# Patient Record
Sex: Female | Born: 2000 | Race: Black or African American | Hispanic: No | Marital: Single | Smoking: Never smoker
Health system: Southern US, Community
[De-identification: ages and names within clinical notes are randomized; demographics above are authoritative.]

## PROBLEM LIST (undated history)

## (undated) DIAGNOSIS — G08 Intracranial and intraspinal phlebitis and thrombophlebitis: Secondary | ICD-10-CM

## (undated) HISTORY — PX: KNEE ARTHROSCOPY: SUR90

## (undated) HISTORY — PX: KNEE CARTILAGE SURGERY: SHX688

---

## 2020-09-01 ENCOUNTER — Ambulatory Visit: Payer: Self-pay | Attending: Internal Medicine

## 2020-09-01 DIAGNOSIS — Z23 Encounter for immunization: Secondary | ICD-10-CM

## 2020-09-01 NOTE — Progress Notes (Signed)
   Covid-19 Vaccination Clinic  Name:  Sahiba Granholm    MRN: [id number] DOB: 11/28/2000  09/01/2020  Ms. Flores was observed post Covid-19 immunization for 30 minutes based on pre-vaccination screening without incident. She was provided with Vaccine Information Sheet and instruction to access the V-Safe system.   Ms. Heather was instructed to call 911 with any severe reactions post vaccine: Marland Kitchen Difficulty breathing  . Swelling of face and throat  . A fast heartbeat  . A bad rash all over body  . Dizziness and weakness   Immunizations Administered    Name Date Dose VIS Date Route   Moderna COVID-19 Vaccine 09/01/2020 11:38 AM 0.5 mL 10/2019 Intramuscular   Manufacturer: Moderna   Lot: [id number]   NDC: 16010-932-35

## 2021-07-16 ENCOUNTER — Ambulatory Visit (HOSPITAL_BASED_OUTPATIENT_CLINIC_OR_DEPARTMENT_OTHER)
Admission: RE | Admit: 2021-07-16 | Discharge: 2021-07-16 | Disposition: A | Discharge status: Home / Self Care | Payer: No Typology Code available for payment source | Source: Ambulatory Visit | Attending: Orthopaedic Surgery | Admitting: Orthopaedic Surgery

## 2021-07-16 ENCOUNTER — Other Ambulatory Visit (HOSPITAL_BASED_OUTPATIENT_CLINIC_OR_DEPARTMENT_OTHER): Payer: Self-pay | Admitting: Orthopaedic Surgery

## 2021-07-16 ENCOUNTER — Other Ambulatory Visit: Payer: Self-pay

## 2021-07-16 ENCOUNTER — Encounter (HOSPITAL_BASED_OUTPATIENT_CLINIC_OR_DEPARTMENT_OTHER): Payer: Self-pay | Admitting: Orthopaedic Surgery

## 2021-07-16 ENCOUNTER — Ambulatory Visit (INDEPENDENT_AMBULATORY_CARE_PROVIDER_SITE_OTHER): Payer: Self-pay | Admitting: Orthopaedic Surgery

## 2021-07-16 VITALS — BP 133/82 | Ht 67.5 in | Wt 133.6 lb

## 2021-07-16 DIAGNOSIS — M25561 Pain in right knee: Secondary | ICD-10-CM

## 2021-07-16 DIAGNOSIS — M25361 Other instability, right knee: Secondary | ICD-10-CM

## 2021-07-16 NOTE — Addendum Note (Signed)
Addended by: Benancio Deeds on: 07/16/2021 03:25 PM   Modules accepted: Orders

## 2021-07-16 NOTE — Progress Notes (Signed)
Chief Complaint: Right knee instability     History of Present Illness:   Pain Score: 7/10 SANE: 60/100  Rebecca Beltran is a 20 y.o. 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  She states that this has been going on for many years since before 2016 when she ultimately had the left knee stabilized.  This was done by Dr. Tyler Deis.  They performed a tibial tubercle osteotomy at that time.  She was also having issues with the right knee at that time but this was not addressed as they were deciding to stage the surgeries.  She states that she has subluxations or dislocations on a nearly weekly basis.  The most recent time this happened was approximately 1 week prior to this visit when she had significant notable swelling following this.  She was unable to improve the swelling until she was given a Medrol Dosepak.  She has pain in the knee with bending and stairs.  She has a constant dull ache in the knee.    Surgical History:   Left knee tibial tubercle osteotomy 2016  PMH/PSH/Family History/Social History/Meds/Allergies:   History reviewed. No pertinent past medical history. History reviewed. No pertinent surgical history. Social History   Socioeconomic History   Marital status: Single    Spouse name: Not on file   Number of children: Not on file   Years of education: Not on file   Highest education level: Not on file  Occupational History   Not on file  Tobacco Use   Smoking status: Not on file   Smokeless tobacco: Not on file  Substance and Sexual Activity   Alcohol use: Not on file   Drug use: Not on file   Sexual activity: Not on file  Other Topics Concern   Not on file  Social History Narrative   Not on file   Social Determinants of Health   Financial Resource Strain: Not on file  Food Insecurity: Not on file  Transportation Needs: Not on file  Physical Activity: Not on file  Stress: Not on file  Social Connections: Not  on file   History reviewed. No pertinent family history. Not on File No current outpatient medications on file.   No current facility-administered medications for this visit.   No results found.  Review of Systems:   A ROS was performed including pertinent positives and negatives as documented in the HPI.  Physical Exam :   Constitutional: NAD and appears stated age Neurological: Alert and oriented Psych: Appropriate affect and cooperative There were no vitals taken for this visit.   Comprehensive Musculoskeletal Exam:     Musculoskeletal Exam  Gait Normal  Alignment Normal   Right Left  Inspection Normal Normal  Palpation    Tenderness Patellofemoral None  Crepitus None None  Effusion 1+ None  Range of Motion    Extension 0 0  Flexion 130 130  Strength    Extension 5/5 5/5  Flexion 5/5 5/5  Ligament Exam     Generalized Laxity No No  Lachman Negative Negative   Pivot Shift Negative Negative  Anterior Drawer Negative Negative  Valgus at 0 Negative Negative  Valgus at 20 Negative Negative  Varus at 0 0 0  Varus at 20   0 0  Posterior Drawer at 90 0 0  Vascular/Lymphatic Exam    Edema None None  Venous Stasis Changes No No  Distal Circulation Normal Normal  Neurologic    Light Touch Sensation Intact Intact  Special Tests:    She has positive J sign.  She has positive apprehension when the knee is brought into extension.  She has 3 quadrants of lateral subluxation which causes significant apprehension  Imaging:   Xray (right knee 4 views): Normal right knee with mild lateral patellar tilt   I personally reviewed and interpreted the radiographs.   Assessment:   20 year old female with right patellar instability.  She is experiencing subluxations on a weekly basis.  Her last dislocation resulted in significant swelling.  Given her recent injury I believe that an MRI is indicated in order to assess the status of her underlying cartilage.  This will also  allow [place] to calculating TT TG distance and ultimate preparation for surgical planning of her patellar realignment to restore stability  Plan :    -MRI right knee -Follow-up 2 weeks to discuss results of the MRI  I believe that advance imaging in the form of an MRI is indicated for the following reasons: -Xrays images were obtained and not diagnostic -The following worrisome symptoms are present on history and exam: Recent injury with significant swelling concerning for chondral injury - MRI is required to assist in specific surgical planning    I personally saw and evaluated the patient, and participated in the management and treatment plan.  Huel Cote, MD Attending Physician, Orthopedic Surgery  This document was dictated using Dragon voice recognition software. A reasonable attempt at proof reading has been made to minimize errors.

## 2021-07-20 ENCOUNTER — Emergency Department (HOSPITAL_COMMUNITY): Payer: No Typology Code available for payment source

## 2021-07-20 ENCOUNTER — Encounter (HOSPITAL_COMMUNITY): Payer: Self-pay

## 2021-07-20 ENCOUNTER — Inpatient Hospital Stay (HOSPITAL_COMMUNITY)
Admission: EM | Admit: 2021-07-20 | Discharge: 2021-07-22 | DRG: 093 | Disposition: A | Discharge status: Home / Self Care | Payer: No Typology Code available for payment source | Attending: Neurology | Admitting: Neurology

## 2021-07-20 DIAGNOSIS — R29702 NIHSS score 2: Secondary | ICD-10-CM | POA: Diagnosis present

## 2021-07-20 DIAGNOSIS — G08 Intracranial and intraspinal phlebitis and thrombophlebitis: Principal | ICD-10-CM | POA: Diagnosis present

## 2021-07-20 DIAGNOSIS — H532 Diplopia: Secondary | ICD-10-CM | POA: Diagnosis present

## 2021-07-20 DIAGNOSIS — Z79899 Other long term (current) drug therapy: Secondary | ICD-10-CM

## 2021-07-20 DIAGNOSIS — T384X5A Adverse effect of oral contraceptives, initial encounter: Secondary | ICD-10-CM | POA: Diagnosis present

## 2021-07-20 DIAGNOSIS — Z20822 Contact with and (suspected) exposure to covid-19: Secondary | ICD-10-CM | POA: Diagnosis present

## 2021-07-20 LAB — I-STAT CHEM 8, ED
BUN: 12 mg/dL (ref 6–20)
Calcium, Ion: 1.1 mmol/L — ABNORMAL LOW (ref 1.15–1.40)
Chloride: 105 mmol/L (ref 98–111)
Creatinine, Ser: 0.9 mg/dL (ref 0.44–1.00)
Glucose, Bld: 133 mg/dL — ABNORMAL HIGH (ref 70–99)
HCT: 46 % (ref 36.0–46.0)
Hemoglobin: 15.6 g/dL — ABNORMAL HIGH (ref 12.0–15.0)
Potassium: 3.6 mmol/L (ref 3.5–5.1)
Sodium: 140 mmol/L (ref 135–145)
TCO2: 23 mmol/L (ref 22–32)

## 2021-07-20 LAB — RESP PANEL BY RT-PCR (FLU A&B, COVID) ARPGX2
Influenza A by PCR: NEGATIVE
Influenza B by PCR: NEGATIVE
SARS Coronavirus 2 by RT PCR: NEGATIVE

## 2021-07-20 LAB — I-STAT BETA HCG BLOOD, ED (MC, WL, AP ONLY): I-stat hCG, quantitative: <5 m[IU]/mL (ref <5)

## 2021-07-20 MED ORDER — SENNOSIDES-DOCUSATE SODIUM 8.6-50 MG PO TABS
1.0000 | ORAL_TABLET | Freq: Two times a day (BID) | ORAL | Status: DC
  Filled 2021-07-20: qty 1

## 2021-07-20 MED ORDER — PANTOPRAZOLE SODIUM 40 MG IV SOLR
40.0000 mg | Freq: Every day | INTRAVENOUS | Status: DC
  Administered 2021-07-21: 40 mg via INTRAVENOUS
  Filled 2021-07-20: qty 40

## 2021-07-20 MED ORDER — PROCHLORPERAZINE EDISYLATE 10 MG/2ML IJ SOLN
5.0000 mg | Freq: Once | INTRAMUSCULAR | Status: AC
  Administered 2021-07-20: 5 mg via INTRAVENOUS
  Filled 2021-07-20: qty 2

## 2021-07-20 MED ORDER — IOHEXOL 350 MG/ML SOLN
75.0000 mL | Freq: Once | INTRAVENOUS | Status: AC | PRN
  Administered 2021-07-20: 75 mL via INTRAVENOUS

## 2021-07-20 MED ORDER — CLEVIDIPINE BUTYRATE 0.5 MG/ML IV EMUL: 0.0000 mg/h | INTRAVENOUS | Status: DC

## 2021-07-20 MED ORDER — ACETAMINOPHEN 325 MG PO TABS
650.0000 mg | ORAL_TABLET | ORAL | Status: DC | PRN
  Administered 2021-07-21 – 2021-07-22 (×5): 650 mg via ORAL
  Filled 2021-07-20 (×5): qty 2

## 2021-07-20 MED ORDER — LABETALOL HCL 5 MG/ML IV SOLN
5.0000 mg | INTRAVENOUS | Status: DC | PRN
Start: 2021-07-20 — End: 2021-07-22

## 2021-07-20 MED ORDER — ACETAMINOPHEN 650 MG RE SUPP: 650.0000 mg | RECTAL | Status: DC | PRN

## 2021-07-20 MED ORDER — ACETAMINOPHEN 160 MG/5ML PO SOLN: 650.0000 mg | ORAL | Status: DC | PRN

## 2021-07-20 MED ORDER — FENTANYL CITRATE PF 50 MCG/ML IJ SOSY
25.0000 ug | PREFILLED_SYRINGE | Freq: Once | INTRAMUSCULAR | Status: AC
  Administered 2021-07-20: 25 ug via INTRAVENOUS
  Filled 2021-07-20: qty 1

## 2021-07-20 MED ORDER — ONDANSETRON HCL 4 MG/2ML IJ SOLN
INTRAMUSCULAR | Status: AC
  Filled 2021-07-20: qty 2

## 2021-07-20 MED ORDER — SODIUM CHLORIDE 0.9 % IV BOLUS
1000.0000 mL | Freq: Once | INTRAVENOUS | Status: AC
  Administered 2021-07-20: 1000 mL via INTRAVENOUS

## 2021-07-20 MED ORDER — STROKE: EARLY STAGES OF RECOVERY BOOK: Freq: Once | Status: DC

## 2021-07-20 MED ORDER — HEPARIN (PORCINE) 25000 UT/250ML-% IV SOLN
850.0000 [IU]/h | INTRAVENOUS | Status: DC
  Administered 2021-07-20: 700 [IU]/h via INTRAVENOUS
  Administered 2021-07-22: 850 [IU]/h via INTRAVENOUS
  Filled 2021-07-20 (×2): qty 250

## 2021-07-20 NOTE — ED Notes (Signed)
Pt ambulated to restroom with a steady gait

## 2021-07-20 NOTE — ED Notes (Signed)
Pt vomiting in room with MD at bedside. See new orders.

## 2021-07-20 NOTE — ED Provider Notes (Signed)
[facility] EMERGENCY DEPARTMENT Provider Note   CSN: [id number] Arrival date & time: 07/20/21  1809     History Chief Complaint  Patient presents with   Headache   Migraine    Pocahontas Cohenour is a 20 y.o. female.  HPI Patient presents with headache, blurry vision, left eye. Onset was today, no clear precipitant. She is on oral contraceptives, otherwise takes no medication regularly, he is generally well. Since onset been in persistent, sore, severe, left parietal, nonradiating substantially.  No right eye vision changes, no extremity weakness.  No relief with anything.    History reviewed. No pertinent past medical history.  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 No pertinent surgical history.   OB History   No obstetric history on file.     No family history on file.     Home Medications Prior to Admission medications   Not on File    Allergies    Patient has no allergy information on record.  Review of Systems   Review of Systems  Constitutional:        Per HPI, otherwise negative  HENT:         Per HPI, otherwise negative  Eyes:  Positive for visual disturbance.  Respiratory:         Per HPI, otherwise negative  Cardiovascular:        Per HPI, otherwise negative  Gastrointestinal:  Negative for vomiting.  Endocrine:       Negative aside from HPI  Genitourinary:        Neg aside from HPI   Musculoskeletal:        Per HPI, otherwise negative  Skin: Negative.   Neurological:  Positive for headaches. Negative for syncope.   Physical Exam Updated Vital Signs BP (!) 132/93 (BP Location: Left Arm)   Pulse 76   Temp 98.6 F (37 C) (Oral)   Resp 15   Ht 5\' 7"  (1.702 m)   Wt 60.3 kg   SpO2 100%   BMI 20.83 kg/m   Physical Exam Vitals and nursing note reviewed.  Constitutional:      Appearance: She is well-developed.     Comments: Uncomfortable appearing  thin young female awake and alert  HENT:     Head: Normocephalic and atraumatic.  Eyes:     Conjunctiva/sclera: Conjunctivae normal.  Cardiovascular:     Rate and Rhythm: Normal rate and regular rhythm.  Pulmonary:     Effort: Pulmonary effort is normal. No respiratory distress.     Breath sounds: Normal breath sounds. No stridor.  Abdominal:     General: There is no distension.  Skin:    General: Skin is warm and dry.  Neurological:     Mental Status: She is alert and oriented to person, place, and time.     Cranial Nerves: No cranial nerve deficit, dysarthria or facial asymmetry.     Motor: No weakness.    ED Results / Procedures / Treatments   Labs (all labs ordered are listed, but only abnormal results are displayed) Labs Reviewed  I-STAT CHEM 8, ED - Abnormal; Notable for the following components:      Result Value   Glucose, Bld 133 (*)    Calcium, Ion 1.10 (*)    Hemoglobin 15.6 (*)    All other components within normal limits  RESP PANEL BY RT-PCR (FLU A&B, COVID) ARPGX2  HEPARIN  LEVEL (UNFRACTIONATED)  CBC  I-STAT BETA HCG BLOOD, ED ([facility], [facility], [facility] ONLY)    Radiology CT HEAD WO CONTRAST ( )  Result Date: 07/20/2021 CLINICAL DATA:  Headache EXAM: CT HEAD WITHOUT CONTRAST TECHNIQUE: Contiguous axial images were obtained from the base of the skull through the vertex without intravenous contrast. COMPARISON:  None. FINDINGS: Brain: There is no mass, hemorrhage or extra-axial collection. The size and configuration of the ventricles and extra-axial CSF spaces are normal. The brain parenchyma is normal, without acute or chronic infarction. Vascular: There is hyperdensity of the most lateral aspect of the left transverse sinus, likely indicating thrombosis. Small amount of hyperdensity in the adjacent posterior fossa may be a thrombosed cortical vein. Skull: The visualized skull base, calvarium and extracranial soft tissues are normal. Sinuses/Orbits: No fluid levels or advanced  mucosal thickening of the visualized paranasal sinuses. No mastoid or middle ear effusion. The orbits are normal. IMPRESSION: 1. Hyperdense appearance of the most lateral aspect of the left transverse sinus, likely indicating thrombosis. Small amount of hyperdensity in the adjacent posterior fossa may be a thrombosed cortical vein, though a small amount of subarachnoid blood is also possible. 2. CT venogram is recommended for confirmation and to better characterize the extent of occlusion. Critical Value/emergent results were called by telephone at the time of interpretation on 07/20/2021 at 9:00 pm to provider [facility] , who verbally acknowledged these results. Electronically Signed   By: Deatra Robinson M.D.   On: 07/20/2021 21:02   CT VENOGRAM HEAD  Result Date: 07/20/2021 CLINICAL DATA:  Headache and suspected dural venous sinus thrombosis. EXAM: CT VENOGRAM HEAD TECHNIQUE: Venographic phase images of the brain were obtained following the administration of intravenous contrast. Multiplanar reformats and maximum intensity projections were generated. CONTRAST:  52mL OMNIPAQUE IOHEXOL 350 MG/ML SOLN COMPARISON:  None. FINDINGS: Superior sagittal sinus: Normal. Straight sinus: Normal. Inferior sagittal sinus, vein of Galen and internal cerebral veins: Normal. Transverse sinuses: There is occlusive thrombus within the lateral left transverse sinus with suspected thrombosis of an adjacent cortical vein. The right transverse sinus is normal. Sigmoid sinuses: Left sigmoid sinus is nonopacified.  Normal right. Visualized jugular veins: Left internal jugular vein is non-opacified. Normal right. IMPRESSION: 1. Occlusive thrombus within the lateral left transverse sinus with suspected thrombosis of an adjacent cortical vein. 2. No enhancement within the left sigmoid sinus or internal jugular vein. Electronically Signed   By: Deatra Robinson M.D.   On: 07/20/2021 21:37    Procedures Procedures   Medications Ordered  in ED Medications  labetalol (NORMODYNE) injection 5 mg (has no administration in time range)  heparin ADULT infusion 100 units/mL (25000 units/240mL) (has no administration in time range)  iohexol (OMNIPAQUE) 350 MG/ML injection 75 mL (75 mLs Intravenous Contrast Given 07/20/21 2128)  sodium chloride 0.9 % bolus 1,000 mL (1,000 mLs Intravenous New Bag/Given 07/20/21 2202)  fentaNYL (SUBLIMAZE) injection 25 mcg (25 mcg Intravenous Given 07/20/21 2202)  ondansetron (ZOFRAN) 4 MG/2ML injection (  Given 07/20/21 2213)    ED Course  I have reviewed the triage vital signs and the nursing notes.  Pertinent labs & imaging results that were available during my care of the patient were reviewed by me and considered in my medical decision making (see chart for details).  Discussed the patient's case with our quick assessment team after she had triage evaluation, CT ordered. Reviewed his images, and discussed findings with the patient upon initial evaluation in the room.  Soon thereafter discussed her case  with our neurology colleagues.  Given concern for venous thromboembolism, intracranial, we discussed options for management.  Patient will start heparin drip, require ICU admission to the neuro team.  I discussed all findings with the patient's mother via speaker phone in the room.  Patient received Zofran, fentanyl for symptom control. MDM Rules/Calculators/A&P MDM Number of Diagnoses or Management Options Acute idiopathic CVST: new, needed workup   Amount and/or Complexity of Data Reviewed Clinical lab tests: ordered and reviewed Tests in the radiology section of CPT: ordered and reviewed Tests in the medicine section of CPT: reviewed and ordered Decide to obtain previous medical records or to obtain history from someone other than the patient: yes Review and summarize past medical records: yes Discuss the patient with other providers: yes Independent visualization of images, tracings, or  specimens: yes  Risk of Complications, Morbidity, and/or Mortality Presenting problems: high Diagnostic procedures: high Management options: high  Critical Care Total time providing critical care: 30-74 minutes (45)  Patient Progress Patient progress: stable   Final Clinical Impression(s) / ED Diagnoses Final diagnoses:  Acute idiopathic CVST     Gerhard Munch, MD 07/20/21 2254

## 2021-07-20 NOTE — ED Provider Notes (Addendum)
Emergency Medicine Provider Triage Evaluation Note  Rebecca Beltran , a 20 y.o. female  was evaluated in triage.  Pt complains of headache located left side of the head.  Has history of headaches but states is worse.  Review of Systems  Positive: Headache, eye pain Negative: fever  Physical Exam  BP 121/89 (BP Location: Left Arm)   Pulse 83   Temp 98.6 F (37 C) (Oral)   Resp 16   Ht 5\' 7"  (1.702 m)   Wt 60.3 kg   SpO2 97%   BMI 20.83 kg/m  Gen:   Awake, no distress   Resp:  Normal effort  MSK:   Moves extremities without difficulty  Other:  Perrl, no facial droop, clear speech, 5/5 strength to the BUE and BLE  Medical Decision Making  Medically screening exam initiated at 7:47 PM.  Appropriate orders placed.  was informed that the remainder of the evaluation will be completed by another provider, this initial triage assessment does not replace that evaluation, and the importance of remaining in the ED until their evaluation is complete.  Discussed with radiology who is concerned for venous sinus thrombosis with possible associated bleeding. Recommends ct venogram of the head.   Advised nursing staff pt needs to be prioritized for a room   Darleene Cleaver, PA-C 07/20/21 1947    Carlene Bickley S, PA-C 07/20/21 2152    2153 P, DO 07/21/21 0000

## 2021-07-20 NOTE — H&P (Signed)
Neurology Consultation Reason for Consult: Cerebral venous sinus thrombosis Requesting Physician: Gerhard Munch  CC: Headache  History is obtained from: Patient and chart review  HPI: Rebecca Beltran is a 20 y.o. female with past medical history significant for bilateral knee dislocations, oral contraceptive use, migraine headaches, presenting with worst headache of life, found to have CVST.  She reports she has been in her normal state of good health recently but had a left-sided severe headache this morning associated with nausea, vomiting, light sensitivity, sound sensitivity, and subsequently paresthesias in the left arm and leg and double vision that developed while she was in the ED.  She states her oral contraceptive pill was changed recently and it does contain estrogen.  She additionally completed a course of prednisone for knee dislocation pain 2 days prior to admission (5-day course).  Regarding her regular headaches she has migraines less than once a month (approximately 10 episodes a year) which she notes are triggered by loud noise flashing lights and associated with nausea/vomiting/light and sound sensitivity, but these have never been so severe as today.  TNK given?: No, not indicated for CVST w/ hemorrhage Premorbid modified rankin scale:      0 - No symptoms.   ICH Score:   Time performed: 11:30 PM on 9/13 GCS: 13-15 is 0 points Infratentorial: No.. If yes, 1 point Volume: <30cc is 0 points  Age: 20 y.o.. >80 is 1 point Intraventricular extension is 1 point  Score:0  A Score of 0 points has a 30 day mortality of 0%. Stroke. 2001 Apr;32(4):891-7.  ROS: All other review of systems was negative except as noted in the HPI.   History reviewed. No pertinent past medical history.   No family history on file. Mother denies any family history of blood clots or other significant issues  Social History:  has no history on file for tobacco use, alcohol use, and drug  use. Patient denies any substance use  Exam: Current vital signs: BP 129/89   Pulse 73   Temp 97.7 F (36.5 C) (Oral)   Resp 16   Ht 5\' 7"  (1.702 m)   Wt 60.3 kg   SpO2 100%   BMI 20.83 kg/m  Vital signs in last 24 hours: Temp:  [97.7 F (36.5 C)-98.6 F (37 C)] 97.7 F (36.5 C) (09/13 2300) Pulse Rate:  [73-83] 73 (09/13 2300) Resp:  [15-16] 16 (09/13 2300) BP: (121-132)/(89-93) 129/89 (09/13 2300) SpO2:  [97 %-100 %] 100 % (09/13 2300) Weight:  [60.3 kg] 60.3 kg (09/13 1917)   Physical Exam  Constitutional: Appears well-developed and well-nourished.  Intermittently has severe retching, dry heaves Psych: Affect appropriate to situation, somewhat anxious and tearful with pain Eyes: No scleral injection HENT: No oropharyngeal obstruction.  MSK: no joint deformities.  Cardiovascular: Normal rate and regular rhythm.  Respiratory: Effort normal, non-labored breathing GI: Soft.  No distension. There is no tenderness.  Skin: Warm dry and intact visible skin  Neuro: Mental Status: Patient is slightly drowsy but alerts easily, oriented to person, place, month, year, and situation. Patient is able to give a clear and coherent history. No signs of aphasia or neglect Cranial Nerves: II: Visual Fields are full. Pupils are equal, round, and reactive to light.   III,IV, VI: EOMI without ptosis or diploplia.  V: Facial sensation is reduced to temperature in left V1, V2 and V3 VII: Facial movement is symmetric.  VIII: hearing is intact to voice X: Uvula elevates symmetrically XI: Shoulder shrug is symmetric.  XII: tongue is midline without atrophy or fasciculations.  Motor: Tone is normal. Bulk is normal.  Confrontational testing limited secondary to severely increased pain with any Valsalva.  No pronator drift and no drift of the bilateral lower extremities Sensory: Sensation is symmetric to light touch in the arms and legs, but is reduced to temperature in the left face and  arm Deep Tendon Reflexes: 3+ and symmetric in the biceps and patellae  Plantars: Toes are downgoing bilaterally.  Cerebellar: FNF and HKS are intact bilaterally  NIHSS total 2 Score breakdown: Reduced temperature sensation on the left face and arm one-point, mild drowsiness one-point   I have reviewed labs in epic and the results pertinent to this consultation are: BMP with creatinine of 0.9, hemoglobin 15.6, beta-hCG negative, respiratory viral panel and SARS-CoV-2 negative  I have personally reviewed the images obtained and agree with radiology reads  Head CT: 1. Hyperdense appearance of the most lateral aspect of the left transverse sinus, likely indicating thrombosis. Small amount of hyperdensity in the adjacent posterior fossa may be a thrombosed cortical vein, though a small amount of subarachnoid blood is also possible. 2. CT venogram is recommended for confirmation and to better characterize the extent of occlusion.  CTV head: 1. Occlusive thrombus within the lateral left transverse sinus with suspected thrombosis of an adjacent cortical vein. 2. No enhancement within the left sigmoid sinus or internal jugular vein.  Impression: 20 year old with CVST in the setting of oral contraceptive use without other known risk factors. Exam is notable for some left-sided paresthesias as well as mildly reduced temperature sensation and signs and symptoms of elevated ICP (double vision on nonmidline gaze, nausea and vomiting, worsening of headache with Valsalva)  Recommendations:  #CVST -Admit to stroke service -Neuro ICU for every hour neurochecks -Heparin drip low goal no bolus protocol -Repeat CNS imaging 6 hours from initial, I will plan on MRI assuming patient remains stable -Stat head CT for any change in neurological status -Blood pressure control to systolic blood pressure less than 140 given concern for possible small subarachnoid hemorrhage -Tylenol and low-dose fentanyl 12.5  mcg q6hr for pain control, hold off on NSAIDs in the setting of heparin drip and concern for some subarachnoid hemorrhage -EKG to confirm safety of continuing Zofran -Hypercoagulability work-up: In the acute phase while on heparin drip will test lupus anticoagulant, beta-2 glycoprotein antibodies, homocystine (check prothrombin gene mutation is elevated), factor V Leiden and cardiolipin antibodies; patient will need the remainder of the panel outside of the acute setting and off of anticoagulation -UA, UDS, CBC, CMP, magnesium, phosphorus levels -NPO except for sips with meds until more clinically stable and not having such frequent emesis -Patient counseled on discontinuing oral contraceptive use -Additional DVT prophylaxis not indicated given patient is on a heparin drip -Pantoprazole 40 mg daily for GI protection   Brooke Dare MD-PhD [facility] [phone] Available 7 PM to 7 AM, outside of these hours please call Neurologist on call as listed on Amion.  Total critical care time: 40 minutes   Critical care time was exclusive of separately billable procedures and treating other patients.   Critical care was necessary to treat or prevent imminent or life-threatening deterioration.   Critical care was time spent personally by me on the following activities: development of treatment plan with patient and/or surrogate as well as nursing, discussions with consultants/primary team, evaluation of patient's response to treatment, examination of patient, obtaining history from patient or surrogate, ordering and performing treatments and  interventions, ordering and review of laboratory studies, ordering and review of radiographic studies, and re-evaluation of patient's condition as needed, as documented above.

## 2021-07-20 NOTE — Progress Notes (Signed)
ANTICOAGULATION CONSULT NOTE  Pharmacy Consult for heparin Indication:  intracranial sinus thrombus  Not on File  Patient Measurements: Height: 5\' 7"  (170.2 cm) Weight: 60.3 kg (133 lb) IBW/kg (Calculated) : 61.6 Heparin Dosing Weight: 60kg  Vital Signs: Temp: 98.6 F (37 C) (09/13 1917) Temp Source: Oral (09/13 1917) BP: 132/93 (09/13 2135) Pulse Rate: 76 (09/13 2135)  Labs: Recent Labs    07/20/21 2137  HGB 15.6*  HCT 46.0  CREATININE 0.90    Estimated Creatinine Clearance: 95.7 mL/min (by C-G formula based on SCr of 0.9 mg/dL).   Medical History: History reviewed. No pertinent past medical history.    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 Pharmacy asked to start IV heparin with low goal and no bolus. H/H ok on iStat, no AC PTA.  Goal of Therapy:  Heparin level 0.3-0.5 units/ml Monitor platelets by anticoagulation protocol: Yes   Plan:  Heparin 700 units/h no bolus Check heparin level in 6h  12, PharmD, [place], [facility] Clinical Pharmacist [phone] Please check AMION for all [facility] Pharmacy numbers 07/20/2021

## 2021-07-20 NOTE — ED Triage Notes (Signed)
Pt c/o migraine HA since approx 10am, endorses L neck/ear pain, pressure behind L eye. 10/10 sharp, stabbing  Hx migraines, took migraine meds no relief

## 2021-07-21 ENCOUNTER — Inpatient Hospital Stay (HOSPITAL_COMMUNITY): Payer: No Typology Code available for payment source

## 2021-07-21 LAB — RAPID URINE DRUG SCREEN, HOSP PERFORMED
Amphetamines: NOT DETECTED
Barbiturates: NOT DETECTED
Benzodiazepines: NOT DETECTED
Cocaine: NOT DETECTED
Opiates: NOT DETECTED
Tetrahydrocannabinol: NOT DETECTED

## 2021-07-21 LAB — LIPID PANEL
Cholesterol: 122 mg/dL (ref 0–200)
HDL: 46 mg/dL (ref >40)
LDL Cholesterol: 66 mg/dL (ref 0–99)
Total CHOL/HDL Ratio: 2.7 RATIO
Triglycerides: 51 mg/dL (ref <150)
VLDL: 10 mg/dL (ref 0–40)

## 2021-07-21 LAB — HIV ANTIBODY (ROUTINE TESTING W REFLEX): HIV Screen 4th Generation wRfx: NONREACTIVE

## 2021-07-21 LAB — COMPREHENSIVE METABOLIC PANEL
ALT: 29 U/L (ref 0–44)
AST: 27 U/L (ref 15–41)
Albumin: 3.5 g/dL (ref 3.5–5.0)
Alkaline Phosphatase: 23 U/L — ABNORMAL LOW (ref 38–126)
Anion gap: 8 (ref 5–15)
BUN: 9 mg/dL (ref 6–20)
CO2: 24 mmol/L (ref 22–32)
Calcium: 8.4 mg/dL — ABNORMAL LOW (ref 8.9–10.3)
Chloride: 105 mmol/L (ref 98–111)
Creatinine, Ser: 0.95 mg/dL (ref 0.44–1.00)
GFR, Estimated: >60 mL/min (ref >60)
Glucose, Bld: 110 mg/dL — ABNORMAL HIGH (ref 70–99)
Potassium: 3.9 mmol/L (ref 3.5–5.1)
Sodium: 137 mmol/L (ref 135–145)
Total Bilirubin: 0.8 mg/dL (ref 0.3–1.2)
Total Protein: 6.3 g/dL — ABNORMAL LOW (ref 6.5–8.1)

## 2021-07-21 LAB — URINALYSIS, ROUTINE W REFLEX MICROSCOPIC
Bilirubin Urine: NEGATIVE
Glucose, UA: NEGATIVE mg/dL
Hgb urine dipstick: NEGATIVE
Ketones, ur: NEGATIVE mg/dL
Leukocytes,Ua: NEGATIVE
Nitrite: NEGATIVE
Protein, ur: NEGATIVE mg/dL
Specific Gravity, Urine: 1.01 (ref 1.005–1.030)
pH: 7 (ref 5.0–8.0)

## 2021-07-21 LAB — CBC
HCT: 36.3 % (ref 36.0–46.0)
Hemoglobin: 12.4 g/dL (ref 12.0–15.0)
MCH: 34.3 pg — ABNORMAL HIGH (ref 26.0–34.0)
MCHC: 34.2 g/dL (ref 30.0–36.0)
MCV: 100.6 fL — ABNORMAL HIGH (ref 80.0–100.0)
Platelets: 291 10*3/uL (ref 150–400)
RBC: 3.61 MIL/uL — ABNORMAL LOW (ref 3.87–5.11)
RDW: 11.9 % (ref 11.5–15.5)
WBC: 10.9 10*3/uL — ABNORMAL HIGH (ref 4.0–10.5)
nRBC: 0 % (ref 0.0–0.2)

## 2021-07-21 LAB — HEPARIN LEVEL (UNFRACTIONATED)
Heparin Unfractionated: 0.29 IU/mL — ABNORMAL LOW (ref 0.30–0.70)
Heparin Unfractionated: 0.16 IU/mL — ABNORMAL LOW (ref 0.30–0.70)

## 2021-07-21 LAB — PHOSPHORUS: Phosphorus: 2.8 mg/dL (ref 2.5–4.6)

## 2021-07-21 LAB — MRSA NEXT GEN BY PCR, NASAL: MRSA by PCR Next Gen: NOT DETECTED

## 2021-07-21 LAB — HEMOGLOBIN A1C
Hgb A1c MFr Bld: 4.8 % (ref 4.8–5.6)
Mean Plasma Glucose: 91.06 mg/dL

## 2021-07-21 LAB — MAGNESIUM: Magnesium: 1.6 mg/dL — ABNORMAL LOW (ref 1.7–2.4)

## 2021-07-21 MED ORDER — SODIUM CHLORIDE 0.9 % IV SOLN: INTRAVENOUS | Status: DC

## 2021-07-21 MED ORDER — ONDANSETRON HCL 4 MG/2ML IJ SOLN
4.0000 mg | Freq: Four times a day (QID) | INTRAMUSCULAR | Status: DC
  Administered 2021-07-21: 4 mg via INTRAVENOUS
  Filled 2021-07-21: qty 2

## 2021-07-21 MED ORDER — CHLORHEXIDINE GLUCONATE CLOTH 2 % EX PADS
6.0000 | MEDICATED_PAD | Freq: Every day | CUTANEOUS | Status: DC
  Administered 2021-07-21: 6 via TOPICAL

## 2021-07-21 MED ORDER — FENTANYL CITRATE PF 50 MCG/ML IJ SOSY: 12.5000 ug | PREFILLED_SYRINGE | INTRAMUSCULAR | Status: DC | PRN

## 2021-07-21 NOTE — Progress Notes (Signed)
ANTICOAGULATION CONSULT NOTE  Pharmacy Consult for heparin Indication:  intracranial sinus thrombus  Not on File  Patient Measurements: Height: 5\' 7"  (170.2 cm) Weight: 60.3 kg (133 lb) IBW/kg (Calculated) : 61.6 Heparin Dosing Weight: 60kg  Vital Signs: Temp: 99.5 F (37.5 C) (09/14 0700) Temp Source: Oral (09/14 0700) BP: 123/83 (09/14 0700) Pulse Rate: 94 (09/14 0700)  Labs: Recent Labs    07/20/21 2137 07/21/21 0027 07/21/21 0756  HGB 15.6*  --  12.4  HCT 46.0  --  36.3  PLT  --   --  291  HEPARINUNFRC  --   --  0.29*  CREATININE 0.90 0.95  --      Estimated Creatinine Clearance: 90.7 mL/min (by C-G formula based on SCr of 0.95 mg/dL).   Medical History: History reviewed. No pertinent past medical history.   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 Pharmacy asked to start IV heparin with low goal and no bolus.   Heparin level this morning is slightly SUBtherapeutic (HL 0.29, goal of 0.3-0.5). CBC stable - no active bleeding noted.   Goal of Therapy:  Heparin level 0.3-0.5 units/ml Monitor platelets by anticoagulation protocol: Yes   Plan:  - Increase Heparin to 750 units/hr (7.5 ml/hr) - Will continue to monitor for any signs/symptoms of bleeding and will follow up with heparin level in 6 hours   Thank you for allowing pharmacy to be a part of this patient's care.  12, PharmD, BCPS Clinical Pharmacist Clinical phone for 07/21/2021: 07/23/2021 07/21/2021 9:18 AM   **Pharmacist phone directory can now be found on [email or web address]).  Listed under [facility].

## 2021-07-21 NOTE — Progress Notes (Signed)
OT Cancellation Note  Patient Details Name: Rebecca Beltran MRN: [id number] DOB: October 12, 2001   Cancelled Treatment:    Reason Eval/Treat Not Completed: Active bedrest order  [facility] Alida Greiner, OT/L   Acute OT Clinical Specialist Acute Rehabilitation Services Pager [phone] Office [phone]  07/21/2021, 7:52 AM

## 2021-07-21 NOTE — Progress Notes (Signed)
PT Cancellation Note  Patient Details Name: Rebecca Beltran MRN: [id number] DOB: May 19, 2001   Cancelled Treatment:    Reason Eval/Treat Not Completed: Active bedrest order - will check back.  Marye Round, PT DPT Acute Rehabilitation Services Pager [phone]  Office [phone]    Truddie Coco 07/21/2021, 8:33 AM

## 2021-07-21 NOTE — Progress Notes (Signed)
Speech Language Cognitive Assessment    07/21/21 1100  SLP Visit Information  SLP Received On 07/21/21  SLP Time Calculation  SLP Start Time (ACUTE ONLY) 1102  SLP Stop Time (ACUTE ONLY) 1117  SLP Time Calculation (min) (ACUTE ONLY) 15 min  Subjective  Subjective Pt awake and alert with family at bedside. Pt reports photosensitivity so light was kept to a minimum.  General Information  HPI 20 y.o. F with PMHx significant for bilateral knee dislocations, oral contraceptive use, migraine headaches, presenting with worst headache of life, found to have CVST. Presented with left-sided severe headache associated with nausea, vomiting, light sensitivity, sound sensitivity, and subsequently paresthesias in the left arm and leg and double vision that developed while she was in the ED.  Prior Functional Status  Cognitive/Linguistic Baseline WFL  Type of Home  (Lives at college)  Available Help at Discharge Family  Education Currently enrolled in college  Vocation Part time employment  Pain Assessment  Pain Assessment Faces  Faces Pain Scale 4  Pain Location left neck/head  Pain Intervention(s) Limited activity within patient's tolerance;Monitored during session  Oral Motor/Sensory Function  Overall Oral Motor/Sensory Function WFL  Cognition  Overall Cognitive Status Impaired/Different from baseline  Arousal/Alertness Awake/alert  Orientation Level Oriented X4  Year 2022  Day of Week Correct  Attention Sustained  Sustained Attention Appears intact  Memory Appears intact  Awareness Appears intact  Problem Solving Appears intact  Safety/Judgment Appears intact  Auditory Comprehension  Overall Auditory Comprehension Appears within functional limits for tasks assessed  Yes/No Questions [facility]  Conversation Complex  Visual Recognition/Discrimination  Discrimination WFL  Reading Comprehension  Reading Status Not tested  Expression  Primary Mode of Expression Verbal  Verbal Expression   Overall Verbal Expression Appears within functional limits for tasks assessed  Initiation No impairment  Level of Generative/Spontaneous Verbalization Sentence  Pragmatics No impairment  Written Expression  Dominant Hand Right  Motor Speech  Overall Motor Speech Appears within functional limits for tasks assessed  Respiration WFL  Phonation Normal  Resonance [facility]  Articulation WFL  Intelligibility Intelligible  Motor Planning [facility]  Motor Speech Errors NA  SLP - End of Session  Patient left in bed;with call bell/phone within reach;with family/visitor present  Assessment  Clinical Impression Statement (ACUTE ONLY) Pt was seen for a speech language evaluation. Oral mechanism exam revealed slight mandibular right asymmetry but otherwise unremarkable. Expressive/receptive language judged to be largely intact. SLP administered the SLUMS examination to assess cognitive status. Pt scored 18/30; score c/b deficits in higher level attention to detail but intact working memory, short term memory, visuospatial skills, and orientation/awareness. Her processing is mildly delayed, has mild photophobia and pt/parent's report 98% back to baseline. She is functional for current venue of care. She may experience difficulty attending to more complex and multi level pieces of information. Pt and parents educated re: findings, recommendations and accessing further ST services cognitive difficulty persists or develop at home, work or school.  SLP Recommendation/Assessment All further Speech Lanaguage Pathology  needs can be addressed in the next venue of care  SLP Visit Diagnosis Cognitive communication deficit (R41.841)  SLP Recommendations  Follow up Recommendations None;Other (comment) (If pt reports difficulty with attention in classes, recommend outpatient SLP services.)  SLP Equipment None recommended by SLP  Individuals Consulted  Consulted and Agree with Results and Recommendations Patient;Family  member/caregiver  Family Member Consulted  parents  SLP Evaluations  $ SLP Speech Visit 1 Visit  SLP Evaluations  $ SLP  EVAL LANGUAGE/SOUND PRODUCTION 1 Procedure   Jeannie Done, SLP-Student

## 2021-07-21 NOTE — Progress Notes (Addendum)
STROKE TEAM PROGRESS NOTE   INTERVAL HISTORY Pt has persistent left sided neck pain, but is otherwise intact. No significant neurologic deficits noted at this time.  Her parents are at the bedside providing support.  She has been using birth control pills but denies any prior history of DVT, pulm embolism, prolonged exertion or dehydration. She has been started on anticoagulation with IV heparin drip and level is borderline low this morning Vitals:   07/21/21 0700 07/21/21 0845 07/21/21 1018 07/21/21 1100  BP: 123/83 119/74 (!) 130/95   Pulse: 94 93 76   Resp: 17 15 11    Temp: 99.5 F (37.5 C)   99.2 F (37.3 C)  TempSrc: Oral   Oral  SpO2: 99% 100% 99%   Weight:      Height:       CBC:  Recent Labs  Lab 07/20/21 2137 07/21/21 0756  WBC  --  10.9*  HGB 15.6* 12.4  HCT 46.0 36.3  MCV  --  100.6*  PLT  --  291   Basic Metabolic Panel:  Recent Labs  Lab 07/20/21 2137 07/21/21 0027  NA 140 137  K 3.6 3.9  CL 105 105  CO2  --  24  GLUCOSE 133* 110*  BUN 12 9  CREATININE 0.90 0.95  CALCIUM  --  8.4*  MG  --  1.6*  PHOS  --  2.8    Lipid Panel: No results for input(s): CHOL, TRIG, HDL, CHOLHDL, VLDL, LDLCALC in the last 168 hours.  HgbA1c: No results for input(s): HGBA1C in the last 168 hours. Urine Drug Screen:  Recent Labs  Lab 07/21/21 0327  LABOPIA NONE DETECTED  COCAINSCRNUR NONE DETECTED  LABBENZ NONE DETECTED  AMPHETMU NONE DETECTED  THCU NONE DETECTED  LABBARB NONE DETECTED    Alcohol Level No results for input(s): ETH in the last 168 hours.  IMAGING past 24 hours CT HEAD WO CONTRAST (07/23/21)  Result Date: 07/20/2021 CLINICAL DATA:  Headache EXAM: CT HEAD WITHOUT CONTRAST TECHNIQUE: Contiguous axial images were obtained from the base of the skull through the vertex without intravenous contrast. COMPARISON:  None. FINDINGS: Brain: There is no mass, hemorrhage or extra-axial collection. The size and configuration of the ventricles and extra-axial CSF  spaces are normal. The brain parenchyma is normal, without acute or chronic infarction. Vascular: There is hyperdensity of the most lateral aspect of the left transverse sinus, likely indicating thrombosis. Small amount of hyperdensity in the adjacent posterior fossa may be a thrombosed cortical vein. Skull: The visualized skull base, calvarium and extracranial soft tissues are normal. Sinuses/Orbits: No fluid levels or advanced mucosal thickening of the visualized paranasal sinuses. No mastoid or middle ear effusion. The orbits are normal. IMPRESSION: 1. Hyperdense appearance of the most lateral aspect of the left transverse sinus, likely indicating thrombosis. Small amount of hyperdensity in the adjacent posterior fossa may be a thrombosed cortical vein, though a small amount of subarachnoid blood is also possible. 2. CT venogram is recommended for confirmation and to better characterize the extent of occlusion. Critical Value/emergent results were called by telephone at the time of interpretation on 07/20/2021 at 9:00 pm to provider [facility] , who verbally acknowledged these results. Electronically Signed   By: [facility] M.D.   On: 07/20/2021 21:02   MR BRAIN WO CONTRAST  Result Date: 07/21/2021 CLINICAL DATA:  Headache and left eye blurry vision. EXAM: MRI HEAD WITHOUT CONTRAST TECHNIQUE: Multiplanar, multiecho pulse sequences of the brain and surrounding structures were  obtained without intravenous contrast. COMPARISON:  None. FINDINGS: Brain: No acute infarct, mass effect or extra-axial collection. No acute or chronic hemorrhage. Normal white matter signal, parenchymal volume and CSF spaces. The midline structures are normal. Vascular: Abnormal flow void of the left transverse sinus, sigmoid sinus and internal jugular vein, consistent with known thrombosis. 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 Skull and upper cervical  spine: Normal calvarium and skull base. Visualized upper cervical spine and soft tissues are normal. Sinuses/Orbits:No paranasal sinus fluid levels or advanced mucosal thickening. No mastoid or middle ear effusion. Normal orbits. IMPRESSION: 1. No acute intracranial abnormality. 2. Abnormal flow void of the left transverse sinus, sigmoid sinus and internal jugular vein, consistent with known thrombosis. 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 Electronically Signed   By: Deatra Robinson M.D.   On: 07/21/2021 03:30   CT VENOGRAM HEAD  Result Date: 07/20/2021 CLINICAL DATA:  Headache and suspected dural venous sinus thrombosis. EXAM: CT VENOGRAM HEAD TECHNIQUE: Venographic phase images of the brain were obtained following the administration of intravenous contrast. Multiplanar reformats and maximum intensity projections were generated. CONTRAST:  28mL OMNIPAQUE IOHEXOL 350 MG/ML SOLN COMPARISON:  None. FINDINGS: Superior sagittal sinus: Normal. Straight sinus: Normal. Inferior sagittal sinus, vein of Galen and internal cerebral veins: Normal. Transverse sinuses: There is occlusive thrombus within the lateral left transverse sinus with suspected thrombosis of an adjacent cortical vein. The right transverse sinus is normal. Sigmoid sinuses: Left sigmoid sinus is nonopacified.  Normal right. Visualized jugular veins: Left internal jugular vein is non-opacified. Normal right. IMPRESSION: 1. Occlusive thrombus within the lateral left transverse sinus with suspected thrombosis of an adjacent cortical vein. 2. No enhancement within the left sigmoid sinus or internal jugular vein. Electronically Signed   By: Deatra Robinson M.D.   On: 07/20/2021 21:37    PHYSICAL EXAM  Mental Status: pt is sitting up in chair watching television with her parents. She is alert, pleasant and cooperative.   oriented to person, place, month, year, and situation. Patient  is able to give a clear and coherent history. No signs of aphasia or neglect Cranial Nerves: II: Visual Fields are full. Pupils are equal, round, and reactive to light.   III,IV, VI: EOMI without ptosis or diploplia.  V: Facial sensation is reduced to temperature in left V1, V2 and V3 VII: Facial movement is symmetric.  VIII: hearing is intact to voice X: Uvula elevates symmetrically XI: Shoulder shrug is symmetric. XII: tongue is midline without atrophy or fasciculations.  Motor: Tone is normal. Bulk is normal.    No pronator drift and no drift of the bilateral lower extremities Sensory: Sensation is symmetric to light touch in the arms and legs, but is reduced to temperature in the left face and arm. Paresthesias noted on initial evaluation are not appreciated by this examiner.  Deep Tendon Reflexes: 3+ and symmetric in the biceps and patellae  Plantars: Toes are downgoing bilaterally.  Cerebellar: FNF and HKS are intact bilaterally Gait: intact     ASSESSMENT/PLAN [name] is a 20 y.o. 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 On heparin drip.    CVST: in the setting of oral contraceptive use without other known risk factors.    CT head 07/20/2021 shows hyperdense appearance of the  most lateral aspect of the left transverse sinus, likely indicating thrombosis.  CTV head:  1. Occlusive thrombus within the lateral left transverse sinus with suspected thrombosis of an adjacent cortical vein. 2. No enhancement within the left sigmoid sinus or internal jugular vein.  MRI  07/21/2021 1. No acute intracranial abnormality. 2. Abnormal flow void of the left transverse sinus, sigmoid sinus and internal jugular vein, consistent with known thrombosis. Magnetic susceptibility effect at the posterior left temporal lobe likely indicates a  thrombosed cortical vein.  Continue with hypercoagulable workup LDL No results found for requested labs within last 47654 hours. HgbA1c No results found for requested labs within last 65035 hours. VTE prophylaxis - heparin drip    Diet   Diet regular Room service appropriate? Yes; Fluid consistency: Thin   No antithrombotic prior to admission, now on heparin IV. Consider anticoagulant for discharge  Therapy recommendations:  home Disposition:  home      Hospital day # 1   I have personally obtained history,examined this patient, reviewed notes, independently viewed imaging studies, participated in medical decision making and plan of care.ROS completed by me personally and pertinent positives fully documented  I have made any additions or clarifications directly to the above note. Agree with note above.  Patient presented with new onset of left-sided occipital headache and neck pain MR venogram shows left transverse and sigmoid sinus thrombosis without any brain infarction or hemorrhage.  Recommend aggressive hydration with normal saline as well as continue IV heparin and transition to oral anticoagulation with Pradaxa tomorrow.  Mobilize out of bed.  Therapy consults.  Patient counseled to quit birth control pills and discuss alternative medication with her gynecologist after discharge.  She was also counseled to maintain aggressive hydration.  Continue close neurological monitoring and strict blood pressure control.  Long discussion with patient and her parents and answered questions This patient is critically ill and at significant risk of neurological worsening, death and care requires constant monitoring of vital signs, hemodynamics,respiratory and cardiac monitoring, extensive review of multiple databases, frequent neurological assessment, discussion with family, other specialists and medical decision making of high complexity.I have made any additions or clarifications directly to the  above note.This critical care time does not reflect procedure time, or teaching time or supervisory time of PA/NP/Med Resident etc but could involve care discussion time.  I spent 30 minutes of neurocritical care time  in the care of  this patient.     Delia Heady, MD Medical Director [facility] Pager: [phone] 07/21/2021 1:33 PM   To contact Stroke Continuity provider, please refer to [email or web address]. After hours, contact General Neurology

## 2021-07-21 NOTE — Progress Notes (Signed)
ANTICOAGULATION CONSULT NOTE  Pharmacy Consult for heparin Indication:  intracranial sinus thrombus  Not on File  Patient Measurements: Height: 5\' 7"  (170.2 cm) Weight: 60.3 kg (133 lb) IBW/kg (Calculated) : 61.6 Heparin Dosing Weight: 60kg  Vital Signs: Temp: 99.5 F (37.5 C) (09/14 1500) Temp Source: Oral (09/14 1500) BP: 130/95 (09/14 1018) Pulse Rate: 76 (09/14 1018)  Labs: Recent Labs    07/20/21 2137 07/21/21 0027 07/21/21 0756 07/21/21 1600  HGB 15.6*  --  12.4  --   HCT 46.0  --  36.3  --   PLT  --   --  291  --   HEPARINUNFRC  --   --  0.29* 0.16*  CREATININE 0.90 0.95  --   --      Estimated Creatinine Clearance: 90.7 mL/min (by C-G formula based on SCr of 0.95 mg/dL).   Medical History: History reviewed. No pertinent past medical history.   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 Pharmacy asked to start IV heparin with low goal and no bolus.   Repeat heparin level 9/14 @1600 - 0.19 Contacted nurse. No issues with heparin running and no long pauses in therapy.   Goal of Therapy:  Heparin level 0.3-0.5 units/ml Monitor platelets by anticoagulation protocol: Yes   Plan:  - Increase Heparin to 850 units/hr (8.5 ml/hr) - Will continue to monitor for any signs/symptoms of bleeding and will follow up with heparin level in 6 hours  -Daily HL and CBC ordered  Thank you for allowing pharmacy to be a part of this patient's care.  10/14, PharmD Clinical Pharmacist  Please check AMION for all [facility] numbers After 10:00 PM, call Main Pharmacy [phone]

## 2021-07-22 ENCOUNTER — Other Ambulatory Visit (HOSPITAL_COMMUNITY): Payer: Self-pay

## 2021-07-22 LAB — HEPARIN LEVEL (UNFRACTIONATED): Heparin Unfractionated: 0.31 IU/mL (ref 0.30–0.70)

## 2021-07-22 LAB — BETA-2-GLYCOPROTEIN I ABS, IGG/M/A
Beta-2 Glyco I IgG: <9 GPI IgG units (ref 0–20)
Beta-2-Glycoprotein I IgA: <9 GPI IgA units (ref 0–25)
Beta-2-Glycoprotein I IgM: <9 GPI IgM units (ref 0–32)

## 2021-07-22 LAB — CBC
HCT: 37.9 % (ref 36.0–46.0)
Hemoglobin: 12.5 g/dL (ref 12.0–15.0)
MCH: 34.3 pg — ABNORMAL HIGH (ref 26.0–34.0)
MCHC: 33 g/dL (ref 30.0–36.0)
MCV: 104.1 fL — ABNORMAL HIGH (ref 80.0–100.0)
Platelets: 272 10*3/uL (ref 150–400)
RBC: 3.64 MIL/uL — ABNORMAL LOW (ref 3.87–5.11)
RDW: 11.9 % (ref 11.5–15.5)
WBC: 7.8 10*3/uL (ref 4.0–10.5)
nRBC: 0 % (ref 0.0–0.2)

## 2021-07-22 LAB — LUPUS ANTICOAGULANT
DRVVT: 40.1 s (ref 0.0–47.0)
PTT Lupus Anticoagulant: 29 s (ref 0.0–51.9)
Thrombin Time: 23 s (ref 0.0–23.0)
dPT Confirm Ratio: 1.03 Ratio (ref 0.00–1.34)
dPT: 34.9 s (ref 0.0–47.6)

## 2021-07-22 LAB — HOMOCYSTEINE: Homocysteine: 10.3 umol/L (ref 0.0–14.5)

## 2021-07-22 MED ORDER — APIXABAN 5 MG PO TABS
10.0000 mg | ORAL_TABLET | Freq: Two times a day (BID) | ORAL | Status: DC
Start: 2021-07-22 — End: 2021-07-22
  Administered 2021-07-22: 10 mg via ORAL
  Filled 2021-07-22: qty 2

## 2021-07-22 MED ORDER — PANTOPRAZOLE SODIUM 40 MG PO TBEC
40.0000 mg | DELAYED_RELEASE_TABLET | Freq: Every day | ORAL | Status: DC
  Administered 2021-07-22: 40 mg via ORAL
  Filled 2021-07-22: qty 1

## 2021-07-22 MED ORDER — ONDANSETRON HCL 4 MG/2ML IJ SOLN: 4.0000 mg | Freq: Four times a day (QID) | INTRAMUSCULAR | Status: DC | PRN

## 2021-07-22 MED ORDER — APIXABAN (ELIQUIS) VTE STARTER PACK (10MG AND 5MG)
ORAL_TABLET | ORAL | 0 refills | Status: DC
  Filled 2021-07-22: qty 74, 30d supply, fill #0

## 2021-07-22 MED ORDER — APIXABAN 5 MG PO TABS: 5.0000 mg | ORAL_TABLET | Freq: Two times a day (BID) | ORAL | Status: DC

## 2021-07-22 NOTE — Discharge Summary (Signed)
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 On day 8, switch to one-5mg  tablet twice daily.       LABORATORY STUDIES CBC    Component Value Date/Time   WBC 7.8 07/22/2021 0024   RBC 3.64 (L) 07/22/2021 0024   HGB 12.5 07/22/2021 0024   HCT 37.9 07/22/2021 0024   PLT 272 07/22/2021 0024   MCV 104.1 (H) 07/22/2021 0024   MCH 34.3 (H) 07/22/2021 0024   MCHC 33.0 07/22/2021 0024   RDW 11.9 07/22/2021 0024   CMP    Component Value Date/Time   NA 137 07/21/2021 0027   K 3.9 07/21/2021 0027   CL 105 07/21/2021 0027   CO2 24 07/21/2021 0027   GLUCOSE 110 (H) 07/21/2021 0027   BUN 9 07/21/2021 0027   CREATININE 0.95 07/21/2021 0027   CALCIUM 8.4 (L) 07/21/2021 0027   PROT 6.3 (L) 07/21/2021 0027   ALBUMIN 3.5 07/21/2021 0027   AST 27 07/21/2021 0027   ALT 29 07/21/2021 0027   ALKPHOS 23 (L) 07/21/2021 0027   BILITOT 0.8 07/21/2021 0027   GFRNONAA >60 07/21/2021 0027   COAGSNo results found for: INR, PROTIME Lipid Panel    Component Value Date/Time   CHOL 122 07/21/2021 0756   TRIG 51 07/21/2021 0756   HDL 46 07/21/2021 0756   CHOLHDL 2.7 07/21/2021 0756   VLDL 10 07/21/2021 0756   LDLCALC 66 07/21/2021 0756   HgbA1C  Lab Results   Component Value Date   HGBA1C 4.8 07/21/2021   Urinalysis    Component Value Date/Time   COLORURINE YELLOW 07/21/2021 0327   APPEARANCEUR CLEAR 07/21/2021 0327   LABSPEC 1.010 07/21/2021 0327   PHURINE 7.0 07/21/2021 0327   GLUCOSEU NEGATIVE 07/21/2021 0327   HGBUR NEGATIVE 07/21/2021 0327   BILIRUBINUR NEGATIVE 07/21/2021 0327   KETONESUR NEGATIVE 07/21/2021 0327   PROTEINUR NEGATIVE 07/21/2021 0327   NITRITE NEGATIVE 07/21/2021 0327   LEUKOCYTESUR NEGATIVE 07/21/2021 0327   Urine Drug Screen     Component Value Date/Time   LABOPIA NONE DETECTED 07/21/2021 0327   COCAINSCRNUR NONE DETECTED 07/21/2021 0327   LABBENZ NONE DETECTED 07/21/2021 0327   AMPHETMU NONE DETECTED 07/21/2021 0327   THCU NONE DETECTED 07/21/2021 0327   LABBARB NONE DETECTED 07/21/2021 0327    Alcohol Level No results found for: ETH   SIGNIFICANT DIAGNOSTIC STUDIES CT HEAD WO CONTRAST (07/23/2021)  Result Date: 07/20/2021 CLINICAL DATA:  Headache EXAM: CT HEAD WITHOUT CONTRAST TECHNIQUE: Contiguous axial images were obtained from the base of the skull through the vertex without intravenous contrast. COMPARISON:  None. FINDINGS: Brain: There is no mass, hemorrhage or extra-axial collection.  The size and configuration of the ventricles and extra-axial CSF spaces are normal. The brain parenchyma is normal, without acute or chronic infarction. Vascular: There is hyperdensity of the most lateral aspect of the left transverse sinus, likely indicating thrombosis. Small amount of hyperdensity in the adjacent posterior fossa may be a thrombosed cortical vein. Skull: The visualized skull base, calvarium and extracranial soft tissues are normal. Sinuses/Orbits: No fluid levels or advanced mucosal thickening of the visualized paranasal sinuses. No mastoid or middle ear effusion. The orbits are normal. IMPRESSION: 1. Hyperdense appearance of the most lateral aspect of the left transverse sinus, likely indicating thrombosis.  Small amount of hyperdensity in the adjacent posterior fossa may be a thrombosed cortical vein, though a small amount of subarachnoid blood is also possible. 2. CT venogram is recommended for confirmation and to better characterize the extent of occlusion. Critical Value/emergent results were called by telephone at the time of interpretation on 07/20/2021 at 9:00 pm to provider [facility] , who verbally acknowledged these results. Electronically Signed   By: Deatra Robinson M.D.   On: 07/20/2021 21:02   MR BRAIN WO CONTRAST  Result Date: 07/21/2021 CLINICAL DATA:  Headache and left eye blurry vision. EXAM: MRI HEAD WITHOUT CONTRAST TECHNIQUE: Multiplanar, multiecho pulse sequences of the brain and surrounding structures were obtained without intravenous contrast. COMPARISON:  None. FINDINGS: Brain: No acute infarct, mass effect or extra-axial collection. No acute or chronic hemorrhage. Normal white matter signal, parenchymal volume and CSF spaces. The midline structures are normal. Vascular: Abnormal flow void of the left transverse sinus, sigmoid sinus and internal jugular vein, consistent with known thrombosis. 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 Skull and upper cervical spine: Normal calvarium and skull base. Visualized upper cervical spine and soft tissues are normal. Sinuses/Orbits:No paranasal sinus fluid levels or advanced mucosal thickening. No mastoid or middle ear effusion. Normal orbits. IMPRESSION: 1. No acute intracranial abnormality. 2. Abnormal flow void of the left transverse sinus, sigmoid sinus and internal jugular vein, consistent with known thrombosis. 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 Electronically Signed   By: Deatra Robinson M.D.   On: 07/21/2021 03:30   DG Knee Complete 4 Views Right  Result Date: 07/19/2021 CLINICAL DATA:  Right  knee pain after patient describes having "dislocation" of her right knee. EXAM: RIGHT KNEE - COMPLETE 4+ VIEW COMPARISON:  None. FINDINGS: No evidence of an acute fracture or dislocation. No evidence of arthropathy or other focal bone abnormality. A very small joint effusion is noted. IMPRESSION: Very small right knee joint effusion. Electronically Signed   By: Aram Candela M.D.   On: 07/19/2021 21:41   CT VENOGRAM HEAD  Result Date: 07/20/2021 CLINICAL DATA:  Headache and suspected dural venous sinus thrombosis. EXAM: CT VENOGRAM HEAD TECHNIQUE: Venographic phase images of the brain were obtained following the administration of intravenous contrast. Multiplanar reformats and maximum intensity projections were generated. CONTRAST:  70mL OMNIPAQUE IOHEXOL 350 MG/ML SOLN COMPARISON:  None. FINDINGS: Superior sagittal sinus: Normal. Straight sinus: Normal. Inferior sagittal sinus, vein of Galen and internal cerebral veins: Normal. Transverse sinuses: There is occlusive thrombus within the lateral left transverse sinus with suspected thrombosis of an adjacent cortical vein. The right transverse sinus is normal. Sigmoid sinuses: Left sigmoid sinus is nonopacified.  Normal right. Visualized jugular veins: Left internal jugular vein is non-opacified. Normal right. IMPRESSION: 1. Occlusive thrombus within the lateral left  transverse sinus with suspected thrombosis of an adjacent cortical vein. 2. No enhancement within the left sigmoid sinus or internal jugular vein. Electronically Signed   By: Deatra Robinson M.D.   On: 07/20/2021 21:37      HISTORY OF PRESENT ILLNESS female with past medical history significant for bilateral knee dislocations, oral contraceptive use, migraine headaches, presenting with worst headache of life, found to have CVST.   She reports she has been in her normal state of good health recently but had a left-sided severe headache this morning associated with nausea, vomiting, light  sensitivity, sound sensitivity, and subsequently paresthesias in the left arm and leg and double vision that developed while she was in the ED.  She states her oral contraceptive pill was changed recently and it does contain estrogen.  She additionally completed a course of prednisone for knee dislocation pain 2 days prior to admission (5-day course).  Regarding her regular headaches she has migraines less than once a month (approximately 10 episodes a year) which she notes are triggered by loud noise flashing lights and associated with nausea/vomiting/light and sound sensitivity, but these have never been so severe as today.   TNK given?: No, not indicated for CVST w/ hemorrhage Premorbid modified rankin scale:      0 - No symptoms.  HOSPITAL COURSE #CVST Likely etiology hormonal oral contraceptive. No prior history of DVT, PE, or CVST. Patient place don heparin gtt, now transitioned to apixaban 5 mg bid x at least several months. Oral contraceptives discontinued and patient counseled on need for additional prophylaxis to avoid pregnancy, mainly need for mechanical barrier rather than hormone treatment. She will follow up with OB/GYN for further management of contraception.   #headache Initially with nausea, vomiting. Much improved. Tylenol as needed. Can utilize ibuprofen for limited time, no more than 1 week, but was counseled on bleed risk with concomitant use of apixaban and expresses understanding.   #history migraine headaches No prophylactic medications. Will follow up in clinic.   DISCHARGE EXAM Blood pressure 121/86, pulse 74, temperature 98.1 F (36.7 C), temperature source Oral, resp. rate 14, height 5\' 7"  (1.702 m), weight 60.3 kg, SpO2 99 %. Mental Status: pt is sitting up in chair, parents in room. She is alert, pleasant and cooperative.   oriented to person, place, month, year, and situation. Patient is able to give a clear and coherent history. No signs of aphasia or  neglect Cranial Nerves: II: Visual Fields are full. Pupils are equal, round, and reactive to light.   III,IV, VI: EOMI without ptosis or diploplia.  V: Facial sensation is reduced to temperature in left V1, V2 and V3 VII: Facial movement is symmetric.  VIII: hearing is intact to voice X: Uvula elevates symmetrically XI: Shoulder shrug is symmetric. XII: tongue is midline without atrophy or fasciculations.  Motor: Tone is normal. Bulk is normal.    No pronator drift and no drift of the bilateral lower extremities Sensory: Sensation is symmetric to light touch in the arms and legs, but is reduced to temperature in the left face and arm. Paresthesias noted on initial evaluation are not appreciated by this examiner.  Deep Tendon Reflexes: 3+ and symmetric in the biceps and patellae  Plantars: Toes are downgoing bilaterally.  Cerebellar: FNF and HKS are intact bilaterally Gait: intact      Discharge Diet       Diet   Diet regular Room service appropriate? Yes; Fluid consistency: Thin   liquids  DISCHARGE PLAN Disposition:  home Eliquis (apixaban) daily x 8-9 months, end date to be determined. Discontinue birth control pills and discussed with gynecologist alternative medications if necessary Follow-up MR venogram at 4 to 6 months to be arranged as outpatient in stroke clinic Ongoing stroke risk factor control by Primary Care Physician at time of discharge Follow-up PCP System, Provider Not In in 2 weeks. Follow-up in [facility] in 8weeks, office to schedule an appointment.   35 minutes were spent preparing discharge.  Sanjuana Letters, PA-C Neurology  I have personally obtained history,examined this patient, reviewed notes, independently viewed imaging studies, participated in medical decision making and plan of care.ROS completed by me personally and pertinent positives fully documented  I have made any additions or clarifications directly to the  above note. Agree with note above.    Delia Heady, MD Medical Director [facility] Pager: [phone] 07/22/2021 6:17 PM

## 2021-07-22 NOTE — Discharge Instructions (Signed)
Information on my medicine - ELIQUIS (apixaban)  This medication education was reviewed with me or my healthcare representative as part of my discharge preparation.    Why was Eliquis prescribed for you? 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   What do You need to know about Eliquis ? The starting dose is 10 mg (two 5 mg tablets) taken TWICE daily for the FIRST SEVEN (7) DAYS, then on 07/28/21 the dose is reduced to ONE 5 mg tablet taken TWICE daily.  Eliquis may be taken with or without food.   Try to take the dose about the same time in the morning and in the evening. If you have difficulty swallowing the tablet whole please discuss with your pharmacist how to take the medication safely.  Take Eliquis exactly as prescribed and DO NOT stop taking Eliquis without talking to the doctor who prescribed the medication.  Stopping may increase your risk of developing a new blood clot.  Refill your prescription before you run out.  After discharge, you should have regular check-up appointments with your healthcare provider that is prescribing your Eliquis.    What do you do if you miss a dose? If a dose of ELIQUIS is not taken at the scheduled time, take it as soon as possible on the same day and twice-daily administration should be resumed. The dose should not be doubled to make up for a missed dose.  Important Safety Information A possible side effect of Eliquis is bleeding. You should call your healthcare provider right away if you experience any of the following: Bleeding from an injury or your nose that does not stop. Unusual colored urine (red or dark brown) or unusual colored stools (red or black). Unusual bruising for unknown reasons. A serious fall or if you hit your head (even if there is no bleeding).  Some medicines may interact with Eliquis and might increase your risk of bleeding or clotting while on Eliquis. To help avoid this, consult your  healthcare provider or pharmacist prior to using any new prescription or non-prescription medications, including herbals, vitamins, non-steroidal anti-inflammatory drugs (NSAIDs) and supplements.  This website has more information on Eliquis (apixaban): http://[email or web address].com/eliquis/home

## 2021-07-22 NOTE — Progress Notes (Signed)
Patient ordered for discharge to home. All discharge instructions reviewed with patient and family with good understanding verbalized. PIV removed. Eliquis delivered to patient by pharmacy. First dose of eliquis given by this RN. All belongings gathered. Patient left unit at this time in NAD.   Blood pressure 121/86, pulse 74, temperature 98.1 F (36.7 C), temperature source Oral, resp. rate 14, height 5\' 7"  (1.702 m), weight 60.3 kg, SpO2 99 %.

## 2021-07-22 NOTE — Evaluation (Addendum)
Physical Therapy Evaluation & Discharge Patient Details Name: Rebecca Beltran MRN: [id number] DOB: 08/15/2001 Today's Date: 07/22/2021  History of Present Illness  20 y/o female presented to ED on 9/13 with migraine HA with associated L neck/ear pain and pressure behind L eye. Paresthesias in L arm and leg and double vision that developed in ED. 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 PMH: bilateral knee dislocations, migraine, oral contraceptive use  Clinical Impression  PTA, patient attends [employer] and independent. Patient functioning at independent level for mobility and stairs. Educated patient on walking program and active lifestyle, patient verbalized understanding. No further skilled PT needs required acutely. No PT follow up recommended at this time.        Recommendations for follow up therapy are one component of a multi-disciplinary discharge planning process, led by the attending physician.  Recommendations may be updated based on patient status, additional functional criteria and insurance authorization.  Follow Up Recommendations No PT follow up    Equipment Recommendations  None recommended by PT    Recommendations for Other Services       Precautions / Restrictions Precautions Precautions: None Restrictions Weight Bearing Restrictions: No      Mobility  Bed Mobility Overal bed mobility: Independent                  Transfers Overall transfer level: Independent Equipment used: None                Ambulation/Gait Ambulation/Gait assistance: Independent Gait Distance (Feet): 250 Feet Assistive device: None Gait Pattern/deviations: WFL(Within Functional Limits)   Gait velocity interpretation: >4.37 ft/sec, indicative of normal walking speed    Stairs Stairs: Yes Stairs assistance: Independent Stair Management: No rails;Alternating  pattern;Forwards Number of Stairs: 3    Wheelchair Mobility    Modified Rankin (Stroke Patients Only)       Balance Overall balance assessment: Independent                                           Pertinent Vitals/Pain Pain Assessment: Faces Faces Pain Scale: Hurts little more Pain Location: left head/neck Pain Descriptors / Indicators: Pressure Pain Intervention(s): Monitored during session    Home Living Family/patient expects to be discharged to:: Private residence Living Arrangements: Other (Comment) (dorm - roommates) Available Help at Discharge: Family;Friend(s) Type of Home: Other(Comment) (in college - lives in dorm) 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, structural              Prior Function Level of Independence: Independent               Hand Dominance   Dominant Hand: Right    Extremity/Trunk Assessment   Upper Extremity Assessment Upper Extremity Assessment: Overall WFL for tasks assessed    Lower Extremity Assessment Lower Extremity Assessment: Overall WFL for tasks assessed (hx of R knee dislocation x 2 weeks ago)    Cervical / Trunk Assessment Cervical / Trunk Assessment: Normal  Communication   Communication: No difficulties  Cognition Arousal/Alertness: Awake/alert Behavior During Therapy: WFL for tasks assessed/performed Overall Cognitive Status: Within Functional Limits for tasks assessed  General Comments      Exercises     Assessment/Plan    PT Assessment Patent does not need any further PT services  PT Problem List         PT Treatment Interventions      PT Goals (Current goals can be found in the Care Plan section)  Acute Rehab PT Goals Patient Stated Goal: to go home PT Goal Formulation: All assessment and education complete, DC therapy    Frequency     Barriers to discharge        Co-evaluation               AM-PAC PT "6 Clicks" Mobility   Outcome Measure Help needed turning from your back to your side while in a flat bed without using bedrails?: None Help needed moving from lying on your back to sitting on the side of a flat bed without using bedrails?: None Help needed moving to and from a bed to a chair (including a wheelchair)?: None Help needed standing up from a chair using your arms (e.g., wheelchair or bedside chair)?: None Help needed to walk in hospital room?: None Help needed climbing 3-5 steps with a railing? : None 6 Click Score: 24    End of Session   Activity Tolerance: Patient tolerated treatment well Patient left: in bed;with family/visitor present Nurse Communication: Mobility status PT Visit Diagnosis: Muscle weakness (generalized) (M62.81)    Time: 3149-7026 PT Time Calculation (min) (ACUTE ONLY): 9 min   Charges:   PT Evaluation $PT Eval Low Complexity: 1 Low          [name] A. Dan Humphreys PT, DPT Acute Rehabilitation Services Pager [phone] Office [phone]   [name] 07/22/2021, 11:17 AM

## 2021-07-22 NOTE — Progress Notes (Signed)
ANTICOAGULATION CONSULT NOTE Pharmacy Consult for Apixaban Indication:  Cerebral Venous Sinus Thromboembolism  Not on File  Patient Measurements: Height: 5\' 7"  (170.2 cm) Weight: 60.3 kg (133 lb) IBW/kg (Calculated) : 61.6   Vital Signs: Temp: 98.1 F (36.7 C) (09/15 0800) Temp Source: Oral (09/15 0800) BP: 121/86 (09/15 0900) Pulse Rate: 74 (09/15 0900)  Labs: Recent Labs    07/20/21 2137 07/21/21 0027 07/21/21 0756 07/21/21 1600 07/22/21 0024  HGB 15.6*  --  12.4  --  12.5  HCT 46.0  --  36.3  --  37.9  PLT  --   --  291  --  272  HEPARINUNFRC  --   --  0.29* 0.16* 0.31  CREATININE 0.90 0.95  --   --   --     Estimated Creatinine Clearance: 90.7 mL/min (by C-G formula based on SCr of 0.95 mg/dL).   Medical History: History reviewed. No pertinent past medical history.  Medications:  Scheduled:    stroke: mapping our early stages of recovery book   Does not apply Once   apixaban  10 mg Oral BID   Followed by   07/24/21 ON 07/29/2021] apixaban  5 mg Oral BID   Chlorhexidine Gluconate Cloth  6 each Topical Daily   ondansetron (ZOFRAN) IV  4 mg Intravenous Q6H   pantoprazole  40 mg Oral Daily   senna-docusate  1 tablet Oral BID    Assessment: Pt was admitted after reports of nausea, vomiting,  light sensitivity, sound sensitivity, and subsequently paresthesias in the left arm and leg and double vision that developed while she was in the ED. She takes an oral contraceptive and has migraines. Denies any PMH of DVT or PE. She has been started on anticoagulation with IV heparin drip and has been recently within goal. Imaging has no change. Plan has been to transition to PO DOAC per neuro.    Plan:  Start Apixaban 10 mg BID x7d followed by 5 mg BID thereafter. Educated pt and family on monitoring for s/sx of bleeding and adherence. Sent medication to [facility] pharmacy and delivered prior to discharge.   Pharmacy signed off. Pharmacy will monitor peripherally.  [facility], PharmD Candidate 07/22/2021,10:06 AM

## 2021-07-22 NOTE — Progress Notes (Signed)
ANTICOAGULATION CONSULT NOTE - Follow Up Consult  Pharmacy Consult for heparin Indication:  intracranial sinus thrombus  Labs: Recent Labs    07/20/21 2137 07/21/21 0027 07/21/21 0756 07/21/21 1600 07/22/21 0024  HGB 15.6*  --  12.4  --  12.5  HCT 46.0  --  36.3  --  37.9  PLT  --   --  291  --  272  HEPARINUNFRC  --   --  0.29* 0.16* 0.31  CREATININE 0.90 0.95  --   --   --     Assessment/Plan:  19yo female therapeutic on heparin after rate change. Will continue infusion at current rate of 850 units/hr and confirm stable with additional level.   Vernard Gambles, PharmD, BCPS  07/22/2021,3:15 AM

## 2021-07-22 NOTE — Progress Notes (Signed)
Occupational Therapy Evaluation  Pt is complaining of pressure in head/neck and light sensitivity. Educated on compensatory strategies to help manage discomfort during ADL tasks. Talked with Mom over the phone. Pt to resume classes once cleared by MD. Pt complains of light sensitivity and "thinking making her head hurt". Discussed need to monitor for difficulty with schoolwork and request accommodations if needed.  Pt/Mom verbalized understanding. OT signing off.     07/22/21 1200  OT Visit Information  Last OT Received On 07/22/21  Assistance Needed +1  History of Present Illness 20 y/o female presented to ED on 9/13 with migraine HA with associated L neck/ear pain and pressure behind L eye. Paresthesias in L arm and leg and double vision that developed in ED. 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 PMH: bilateral knee dislocations, migraine, oral contraceptive use  Precautions  Precautions None  Home Living  Family/patient expects to be discharged to: Private residence  Living Arrangements Other (Comment) (dorm - roommates)  Available Help at Discharge Family;Friend(s)  Type of Home Other(Comment) (in college - lives in dorm)  Product manager  Prior Function  Level of Independence Independent  Comments student at [employer]  Communication No difficulties  Pain Assessment  Pain Assessment 0-10  Pain Score 7  Pain Location left head/neck  Pain Descriptors / Indicators Pressure  Pain Intervention(s) Limited activity within patient's tolerance  Cognition  Arousal/Alertness Awake/alert  Behavior During Therapy WFL for tasks assessed/performed  Overall Cognitive Status Within Functional Limits for tasks assessed  General Comments "thinking makes my head hurt"  Upper Extremity Assessment  Upper Extremity Assessment Overall WFL for tasks assessed  Lower Extremity Assessment  Lower  Extremity Assessment Defer to PT evaluation  Cervical / Trunk Assessment  Cervical / Trunk Assessment Normal  ADL  Overall ADL's  At baseline  General ADL Comments Educated on compensatory strateiges to reduce feelings of head pressure when bathing and dressing by using figure four positoining in sitting; pt verbalized understanding.  Vision- History  Baseline Vision/History 1 Wears glasses  Patient Visual Report Other (comment) (light sensitivity)  Bed Mobility  Overal bed mobility Independent  Transfers  Overall transfer level Independent  Equipment used None  Balance  Overall balance assessment Independent  OT - End of Session  Activity Tolerance Patient tolerated treatment well  Patient left in bed;with call bell/phone within reach  Nurse Communication Mobility status  OT Assessment  OT Recommendation/Assessment Patient does not need any further OT services  OT Visit Diagnosis Pain  Pain - part of body  (head/neck)  OT Problem List Pain  AM-PAC OT "6 Clicks" Daily Activity Outcome Measure (Version 2)  Help from another person eating meals? 4  Help from another person taking care of personal grooming? 4  Help from another person toileting, which includes using toliet, bedpan, or urinal? 4  Help from another person bathing (including washing, rinsing, drying)? 4  Help from another person to put on and taking off regular upper body clothing? 4  Help from another person to put on and taking off regular lower body clothing? 4  6 Click Score 24  Progressive Mobility  What is the highest level of mobility based on the progressive mobility assessment? Level 6 (Walks independently in room and hall) - Balance while walking in room without assist - Complete  OT Recommendation  Follow Up Recommendations No OT follow up  OT Equipment None recommended by  OT  Acute Rehab OT Goals  Patient Stated Goal to go home  OT Time Calculation  OT Start Time (ACUTE ONLY) 0936  OT Stop Time  (ACUTE ONLY) 1001  OT Time Calculation (min) 25 min  OT General Charges  $OT Visit 1 Visit  OT Evaluation  $OT Eval Moderate Complexity 1 Mod  Written Expression  Dominant Hand Right  Luisa Dago, OT/L   Acute OT Clinical Specialist Acute Rehabilitation Services Pager [phone] Office [phone]

## 2021-07-23 LAB — CARDIOLIPIN ANTIBODIES, IGG, IGM, IGA
Anticardiolipin IgA: <9 APL U/mL (ref 0–11)
Anticardiolipin IgG: <9 GPL U/mL (ref 0–14)
Anticardiolipin IgM: <9 MPL U/mL (ref 0–12)

## 2021-07-26 ENCOUNTER — Telehealth: Payer: Self-pay

## 2021-07-26 ENCOUNTER — Other Ambulatory Visit (HOSPITAL_COMMUNITY): Payer: Self-pay

## 2021-07-26 ENCOUNTER — Other Ambulatory Visit: Payer: Self-pay | Admitting: Emergency Medicine

## 2021-07-26 LAB — FACTOR 5 LEIDEN

## 2021-07-26 MED ORDER — APIXABAN 5 MG PO TABS: 5.0000 mg | ORAL_TABLET | Freq: Two times a day (BID) | ORAL | 0 refills | Status: DC

## 2021-07-26 MED ORDER — APIXABAN 5 MG PO TABS
5.0000 mg | ORAL_TABLET | Freq: Two times a day (BID) | ORAL | 0 refills | Status: DC
  Filled 2021-07-26: qty 180, 90d supply, fill #0

## 2021-07-26 NOTE — Telephone Encounter (Signed)
LVM for patient to return call.  If patient calls back, please let her know   A 90 day refill for Eliquis 5 mg tablets to be taken, 1 tablet twice daily has been called in for patient.

## 2021-07-26 NOTE — Telephone Encounter (Signed)
We received a call from this patients mother. This patient saw Dr Pearlean Brownie in the ED and was prescribed APIXABAN (ELIQUIS) VTE STARTER PACK (10MG  AND 5MG ) but was only given a 30 day supply of same. She is requesting a refill on this medication, as her follow up appointment in not until November. Can you please advise if you are able to refill this prescription since she saw you in the ED? She would like the prescription sent to .   Thank you!

## 2021-07-30 ENCOUNTER — Ambulatory Visit (HOSPITAL_BASED_OUTPATIENT_CLINIC_OR_DEPARTMENT_OTHER): Payer: Self-pay | Admitting: Orthopaedic Surgery

## 2021-08-12 ENCOUNTER — Telehealth: Payer: Self-pay | Admitting: Pharmacist

## 2021-08-12 NOTE — Telephone Encounter (Signed)
Pharmacy Transitions of Care Follow-up Telephone Call  Date of discharge: 07/22/2021  Discharge Diagnosis: Venous Thrombosis  How have you been since you were released from the hospital? I spoke with Rebecca Beltran today regarding her health since discharge.  She states she is still having headaches.   Medication changes made at discharge:  - START: Apixaban 5mg   - STOPPED: n/a  - CHANGED: n/a  Medication changes verified by the patient? yes    Medication Accessibility:  Home Pharmacy: [facility] in [place], [place]   Was the patient provided with refills on discharged medications? No, but refills were requested by mother and sent to [facility] for a 90 day supply   Have all prescriptions been transferred from [facility] to home pharmacy? N/a   Is the patient able to afford medications? yes Notable copays: n/a Eligible patient assistance: n/a    Medication Review: APIXABAN (ELIQUIS)  Apixaban 10 mg BID initiated on 07/22/21. Will switch to apixaban 5 mg BID after 7 days (07/29/2021).  - Discussed importance of taking medication around the same time everyday  - Reviewed potential DDIs with patient  - Advised patient of medications to avoid (NSAIDs, ASA)  - Educated that Tylenol (acetaminophen) will be the preferred analgesic to prevent risk of bleeding  - Emphasized importance of monitoring for signs and symptoms of bleeding (abnormal bruising, prolonged bleeding, nose bleeds, bleeding from gums, discolored urine, black tarry stools)  - Advised patient to alert all providers of anticoagulation therapy prior to starting a new medication or having a procedure   Follow-up Appointments:  Specialist Hospital f/u appt confirmed? yes Scheduled to see [facility] on 09/13/21.   If their condition worsens, is the pt aware to call PCP or go to the Emergency Dept.? yes  Final Patient Assessment: Rebecca Beltran is in good spirits.  She is a Rebecca Beltran at [employer].  Her  mother lives in [place].  She is still having headaches and told her mother who was going to call [facility] today.  I suggested to keep a headache diary/log regarding her headaches, how the feel, location and duration.  I also reminded her to call her provider or seek medication attention if worsening symptoms.

## 2021-08-25 ENCOUNTER — Telehealth: Payer: Self-pay | Admitting: Neurology

## 2021-08-25 NOTE — Telephone Encounter (Signed)
Pt called wanting to know what she can and cannot eat while taking the apixaban (ELIQUIS) 5 MG TABS tablet pt requesting a call back.

## 2021-08-25 NOTE — Telephone Encounter (Signed)
Spoke to pt, relayed message from Dr Pearlean Brownie that there are no food restrictions for Eliquis. Pt voiced understanding.

## 2021-09-07 ENCOUNTER — Encounter: Payer: Self-pay | Admitting: Adult Health

## 2021-09-07 ENCOUNTER — Ambulatory Visit: Payer: No Typology Code available for payment source | Admitting: Adult Health

## 2021-09-07 VITALS — BP 115/77 | HR 104 | Ht 68.0 in | Wt 134.0 lb

## 2021-09-07 DIAGNOSIS — N921 Excessive and frequent menstruation with irregular cycle: Secondary | ICD-10-CM | POA: Diagnosis not present

## 2021-09-07 DIAGNOSIS — Z7901 Long term (current) use of anticoagulants: Secondary | ICD-10-CM | POA: Diagnosis not present

## 2021-09-07 DIAGNOSIS — G08 Intracranial and intraspinal phlebitis and thrombophlebitis: Secondary | ICD-10-CM | POA: Diagnosis not present

## 2021-09-07 DIAGNOSIS — G43109 Migraine with aura, not intractable, without status migrainosus: Secondary | ICD-10-CM

## 2021-09-07 NOTE — Progress Notes (Signed)
[facility] [address] [place]. [place] [address] [phone]       HOSPITAL FOLLOW UP NOTE  [name] Date of Birth:  06-Aug-2001 Medical Record Number:  [id number]   Reason for Referral:  hospital stroke follow up    SUBJECTIVE:   CHIEF COMPLAINT:  Chief Complaint  Patient presents with   Follow-up    Rm 3 alone Pt is well, states she was having daily headaches for about 2 weeks but they have resolved now. No current issues     HPI:   Rebecca Beltran is a 20 y.o. female with past medical history significant for bilateral knee dislocations, oral contraceptive use, and migraine headaches, who presented on 07/20/2021 with left-sided severe headache associated with nausea, vomiting, light sensitivity, sound sensitivity and subsequently paresthesias to left arm and leg and double vision that developed while in ED.  Personally reviewed hospitalization pertinent progress notes, lab work and imaging.  Evaluated by Dr. Pearlean Brownie.  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  MR brain no acute abnormality.  Of note, OCP recently changed continue estrogen.  Also recently completed prednisone course 2 days prior for knee dislocation pain.  Migraine headaches less than once a month.  Placed on heparin drip and transition to apixaban 5 mg twice daily for at least 8-9  months.  Recommended follow-up MRV 4-6 months for surveillance monitoring.  OCP discontinued and advised follow-up with OB/GYN for further management. Headache greatly improved prior to discharge.  A1c 4.8.  LDL 66.  Residual sensory impairment to left face and arm.  Discharged home on 07/22/2021.   Today, 09/07/2021, being seen for initial hospital follow-up unaccompanied.  She reports initially experiencing daily headaches for about 2 weeks after discharge but these have since resolved.  Left-sided sensory deficit resolved.  Denies new or  reoccurring neurological symptoms.  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 Use of Excedrin with resolution. More recent headaches different from typical migraine headaches.  Remains on Eliquis 5 mg twice daily - has been experiencing heavy menstrual cycles which are lasting 5-7 days in during (has had 2 cycles since discharge). She has not yet been seen by OB/GYN. She did stop OCP. Denies hx of heavy menstrual cycles even when not on BC.   No further concerns at this time      PERTINENT IMAGING  CT VENOGRAM HEAD 07/20/2021 IMPRESSION: 1. Occlusive thrombus within the lateral left transverse sinus with suspected thrombosis of an adjacent cortical vein. 2. No enhancement within the left sigmoid sinus or internal jugular vein.  MR BRAIN WO CONTRAST 07/21/2021 IMPRESSION: 1. No acute intracranial abnormality. 2. Abnormal flow void of the left transverse sinus, sigmoid sinus and internal jugular vein, consistent with known thrombosis. 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      ROS:   14 system review of systems performed and negative with exception of those listed in HPI  PMH: History reviewed. No pertinent past medical history.  PSH: History reviewed. No pertinent surgical history.  Social History:  Social History   Socioeconomic History   Marital status: Single    Spouse name: Not on file   Number of children: Not on file   Years of education: Not on file   Highest education level: Not on file  Occupational History   Not  on file  Tobacco Use   Smoking status: Not on file   Smokeless tobacco: Not on file  Substance and Sexual Activity   Alcohol use: Not on file   Drug use: Not on file   Sexual activity: Not on file  Other Topics Concern   Not on file  Social History Narrative   Not on file   Social  Determinants of Health   Financial Resource Strain: Not on file  Food Insecurity: Not on file  Transportation Needs: Not on file  Physical Activity: Not on file  Stress: Not on file  Social Connections: Not on file  Intimate Partner Violence: Not on file    Family History: History reviewed. No pertinent family history.  Medications:   Current Outpatient Medications on File Prior to Visit  Medication Sig Dispense Refill   apixaban (ELIQUIS) 5 MG TABS tablet Take 1 tablet (5 mg total) by mouth 2 (two) times daily. 180 tablet 0   No current facility-administered medications on file prior to visit.    Allergies:  Not on File    OBJECTIVE:  Physical Exam  Vitals:   09/07/21 0908  BP: 115/77  Pulse: (!) 104  Weight: 134 lb (60.8 kg)  Height: 5\' 8"  (1.727 m)   Body mass index is 20.37 kg/m. No results found.  General: well developed, well nourished, young very pleasant African-American female, seated, in no evident distress Head: head normocephalic and atraumatic.   Neck: supple with no carotid or supraclavicular bruits Cardiovascular: regular rate and rhythm, no murmurs Musculoskeletal: no deformity Skin:  no rash/petichiae Vascular:  Normal pulses all extremities   Neurologic Exam Mental Status: Awake and fully alert.  Fluent speech and language.  Oriented to place and time. Recent and remote memory intact. Attention span, concentration and fund of knowledge appropriate. Mood and affect appropriate.  Cranial Nerves: Fundoscopic exam reveals sharp disc margins. Pupils equal, briskly reactive to light. Extraocular movements full without nystagmus. Visual fields full to confrontation. Hearing intact. Facial sensation intact. Face, tongue, palate moves normally and symmetrically.  Motor: Normal bulk and tone. Normal strength in all tested extremity muscles Sensory.: intact to touch , pinprick , position and vibratory sensation.  Coordination: Rapid alternating movements  normal in all extremities. Finger-to-nose and heel-to-shin performed accurately bilaterally. Gait and Station: Arises from chair without difficulty. Stance is normal. Gait demonstrates normal stride length and balance without use of AD. Tandem walk and heel toe without difficulty.  Reflexes: 1+ and symmetric. Toes downgoing.     NIHSS  0 Modified Rankin  0      ASSESSMENT: Rebecca Beltran is a 20 y.o. 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 Vascular risk factors include oral contraceptive use and migraine headaches.      PLAN:  CVST : initially experiencing headaches but since resolved without any reoccurrence of the past 2 weeks.  Continue Eliquis (apixaban) daily for at least 8-9 months per Dr. 07/22/2021. Plan to repeat MRV around 12/2021.   OCP use: advised to f/u with OB/GYN to discuss other contraceptive methods. Avoid estrogen in setting of recent CVST as well as known aura migraines. Reports heavy menstrual cycles with longer duration possibly in setting of Eliquis use or possible due to stopping OCP (although denies hx of menorrhea) - check CBC today. Again, encouraged her to follow-up with OB/GYN to further discuss Migraine headaches: chronic hx. Stable without any recent headaches.     Follow up in 5 months  or call earlier if needed   CC:  [facility] provider: Dr. Pearlean Brownie  I spent 56 minutes of face-to-face and non-face-to-face time with patient.  This included previsit chart review including review of recent hospitalization, lab review, study review, order entry, electronic health record documentation, patient education regarding recent CVST including etiology, ongoing use of Eliquis and plan for repeat imaging indication, prior headaches and history of chronic migraines, OCP use and heavy menstrual cycles and answered all other questions to patient satisfaction   Ihor Austin, AGNP-BC  [facility] [address] [place], [place] [address]  Phone [phone] Fax [phone] Note: This document was prepared with digital dictation and possible smart phrase technology. Any transcriptional errors that result from this process are unintentional.

## 2021-09-07 NOTE — Patient Instructions (Addendum)
Continue Eliquis (apixaban) daily - plan on continuing for at least 8-9 months  Plan on repeat imaging around 12/2021 for monitoring  Please let me know if you have return of headaches or if migraine headaches worsen  We will check blood work today due to heavy menstrual bleeding but please ensure you follow up with your OB to discuss other treatment options      Followup in the future with me in 5 months or call earlier if needed       Thank you for coming to see [place] at [facility]. I hope we have been able to provide you high quality care today.  You may receive a patient satisfaction survey over the next few weeks. We would appreciate your feedback and comments so that we may continue to improve ourselves and the health of our patients.

## 2021-09-08 LAB — CBC
Hematocrit: 39.2 % (ref 34.0–46.6)
Hemoglobin: 12.8 g/dL (ref 11.1–15.9)
MCH: 33.2 pg — ABNORMAL HIGH (ref 26.6–33.0)
MCHC: 32.7 g/dL (ref 31.5–35.7)
MCV: 102 fL — ABNORMAL HIGH (ref 79–97)
Platelets: 245 10*3/uL (ref 150–450)
RBC: 3.85 x10E6/uL (ref 3.77–5.28)
RDW: 11.9 % (ref 11.7–15.4)
WBC: 5.8 10*3/uL (ref 3.4–10.8)

## 2021-09-09 ENCOUNTER — Telehealth: Payer: Self-pay | Admitting: *Deleted

## 2021-09-09 NOTE — Telephone Encounter (Signed)
Spoke with patient and informed her the lab work is satisfactory.  I reminded her to schedule a follow-up visit with her OB/GYN.  she verbalized understanding, appreciation.

## 2021-09-13 ENCOUNTER — Inpatient Hospital Stay: Payer: No Typology Code available for payment source | Admitting: Adult Health

## 2021-10-04 ENCOUNTER — Telehealth: Payer: Self-pay | Admitting: Adult Health

## 2021-10-04 DIAGNOSIS — G441 Vascular headache, not elsewhere classified: Secondary | ICD-10-CM

## 2021-10-04 DIAGNOSIS — G08 Intracranial and intraspinal phlebitis and thrombophlebitis: Secondary | ICD-10-CM

## 2021-10-04 NOTE — Telephone Encounter (Signed)
Order placed to repeat CT venogram to look for recurrence or worsening of known CVST as headaches resolved and now recurrence 2 months later. If headaches severe or are accompanied by nausea/vomiting, visual changes, one-sided weakness or numbness, speech difficulties or altered mental status, she needs to call 911 immediately for emergent evaluation.

## 2021-10-04 NOTE — Telephone Encounter (Signed)
Pt called states she is getting headaches again mainly on her left side behind ear. Pt requesting a call back.

## 2021-10-04 NOTE — Telephone Encounter (Signed)
Any suggestions>?

## 2021-10-04 NOTE — Telephone Encounter (Signed)
PHONE RM PLEASE PARK CALL   Contacted pt back, LVM rq call back before office closes at 5

## 2021-10-04 NOTE — Telephone Encounter (Signed)
Contacted pt mother per DPR, LVM rq she and/or pt give me a call back.

## 2021-10-04 NOTE — Telephone Encounter (Signed)
Pt called back, informed her Shanda Bumps placed a Order to repeat CT venogram to look for recurrence or worsening of known CVST as headaches resolved and now recurrence 2 months later. She stated the headache reoccurred last Tuesday, 11/22. She denied headache being associated by nausea/vomiting, visual changes, one-sided weakness or numbness, speech difficulties or altered mental status. She is just having headaches. Advised she needs to call 911 immediately for emergent evaluation if any other symptoms occur. Pt understood and was appreciative for call back.

## 2021-10-05 ENCOUNTER — Telehealth: Payer: Self-pay | Admitting: Adult Health

## 2021-10-05 NOTE — Telephone Encounter (Signed)
Medcost auth: [id number] (exp. 10/05/21 to 01/03/22), sent message to Rinaldo Cloud at GI to reach out to the patient as soon as possible to schedule.

## 2021-10-12 ENCOUNTER — Ambulatory Visit
Admission: RE | Admit: 2021-10-12 | Discharge: 2021-10-12 | Disposition: A | Discharge status: Home / Self Care | Payer: No Typology Code available for payment source | Source: Ambulatory Visit | Attending: Adult Health | Admitting: Adult Health

## 2021-10-12 ENCOUNTER — Other Ambulatory Visit: Payer: Self-pay

## 2021-10-12 ENCOUNTER — Telehealth: Payer: Self-pay | Admitting: *Deleted

## 2021-10-12 DIAGNOSIS — G441 Vascular headache, not elsewhere classified: Secondary | ICD-10-CM

## 2021-10-12 MED ORDER — TOPIRAMATE 25 MG PO TABS: 25.0000 mg | ORAL_TABLET | Freq: Two times a day (BID) | ORAL | 3 refills | Status: DC

## 2021-10-12 MED ORDER — IOPAMIDOL (ISOVUE-370) INJECTION 76%
75.0000 mL | Freq: Once | INTRAVENOUS | Status: AC | PRN
  Administered 2021-10-12: 75 mL via INTRAVENOUS

## 2021-10-12 NOTE — Addendum Note (Signed)
Addended by: Ihor Austin L on: 10/12/2021 05:36 PM   Modules accepted: Orders

## 2021-10-12 NOTE — Telephone Encounter (Signed)
Contacted patient. She continues to have headaches that occur daily where she will experience a zap type sensation behind her left ear and feel like they go straight through her head then stop. They do not last long but can happen multiple times during the day.    Recommend starting topamax 25mg  twice daily - discussed potential side effects and to call after 1 week if no benefit or sooner if difficulty tolerating.

## 2021-10-12 NOTE — Telephone Encounter (Signed)
Spoke with patient and informed her that recent imaging showed resolution of prior thrombosis which is very good news! If she is still having headaches, let me know and we can discuss further treatment options.  Otherwise, please continue current regimen. Her headaches are less frequent but still of same intensity. She asked if she can take something besides Tylenol. I advised her I'll send to NP and let her know tomorrow. Patient verbalized understanding, appreciation.

## 2021-10-27 ENCOUNTER — Telehealth: Payer: Self-pay | Admitting: Adult Health

## 2021-10-27 NOTE — Telephone Encounter (Signed)
Pt called states overall the medicine is helping however she says that she is having a reaction to it. Says her skin is itching all over as if something is crawling on her. Pt requesting a call back.

## 2021-10-27 NOTE — Telephone Encounter (Signed)
I agree with your recommendation. Thank you.  

## 2021-10-27 NOTE — Telephone Encounter (Signed)
I called the pt and left a vm asking for a call back. I am not sure which medication the pt is referring? Clarity on this message is needed.

## 2021-10-27 NOTE — Telephone Encounter (Signed)
Pt returned my call. She sts the topamax is working well for her headaches but since she started taking the med at times she feels like her skin is crawling. Pt reports she started Topamax 25 mg bid on 10/14/2021 sx started soon after.  I advised this can be a common sx with Topamax and she could try taking 1 tablet for 1 week and then bump back up to the 2 tablets to see if her sx improve.  Pt was agreeable to this plan. I advised I would also send message to NP for review and further input/recommendations if needed. Pt verbalized understanding/appreciation for the call. Pt was advised I would call back if NP had new instructions after reviewing this message.

## 2021-11-02 ENCOUNTER — Telehealth: Payer: Self-pay | Admitting: Adult Health

## 2021-11-02 DIAGNOSIS — G441 Vascular headache, not elsewhere classified: Secondary | ICD-10-CM

## 2021-11-02 NOTE — Telephone Encounter (Signed)
Pt called wanting to speak to the RN or NP about increasing her dosage on her topiramate (TOPAMAX) 25 MG tablet due to it not working very well for her.

## 2021-11-02 NOTE — Telephone Encounter (Signed)
I called the pt back and we discussed message. Pt reports since decreasing the topamax the crawling skin sensations reported on 10/27/21 have improved but h/a have increased. Pt increased back to topamax 25 mg bid on 10/29/21 but h/a have persisted.  Pt did mention her monthly cycle has started and last month her h/a increased around this time. Pt was encourage to take tylenol as recommended by the bottle to see if this would help and that I would advise NP on message for any further recommendations.

## 2021-11-02 NOTE — Telephone Encounter (Signed)
Patient with hx of CVST. Initially headaches improved but reoccurred last month. Repeat CTV showed resolution of CVST. Want to ensure there would not be a concern of elevated ICP as CVST resolved or would she need LP as headaches have persisted?

## 2021-11-09 MED ORDER — TOPIRAMATE 50 MG PO TABS: 50.0000 mg | ORAL_TABLET | Freq: Two times a day (BID) | ORAL | 5 refills | Status: DC

## 2021-11-09 NOTE — Addendum Note (Signed)
Addended by: Ihor Austin L on: 11/09/2021 09:08 AM   Modules accepted: Orders

## 2021-11-09 NOTE — Telephone Encounter (Signed)
Please increase topiramate from 25mg  BID to 50mg  BID. New rx sent to her pharmacy. Thank you.

## 2021-11-09 NOTE — Telephone Encounter (Signed)
I called the pt and left a vm relaying the recommendation. Pt was advised to CB if she had any questions/concerns.

## 2021-11-15 ENCOUNTER — Telehealth: Payer: Self-pay | Admitting: Adult Health

## 2021-11-15 MED ORDER — APIXABAN 5 MG PO TABS: 5.0000 mg | ORAL_TABLET | Freq: Two times a day (BID) | ORAL | 0 refills | Status: DC

## 2021-11-15 NOTE — Telephone Encounter (Signed)
Pt is requesting a refill for apixaban (ELIQUIS) 5 MG TABS tablet.  Pharmacy: [facility]

## 2021-11-15 NOTE — Addendum Note (Signed)
Addended by: Ann Maki on: 11/15/2021 01:07 PM   Modules accepted: Orders

## 2021-11-15 NOTE — Telephone Encounter (Signed)
Refill has been sent.  °

## 2021-11-29 ENCOUNTER — Encounter: Payer: Self-pay | Admitting: Adult Health

## 2021-12-02 ENCOUNTER — Ambulatory Visit: Payer: No Typology Code available for payment source | Admitting: Adult Health

## 2021-12-02 ENCOUNTER — Encounter: Payer: Self-pay | Admitting: Adult Health

## 2021-12-02 VITALS — BP 115/78 | HR 90 | Ht 67.0 in | Wt 135.0 lb

## 2021-12-02 DIAGNOSIS — G08 Intracranial and intraspinal phlebitis and thrombophlebitis: Secondary | ICD-10-CM

## 2021-12-02 DIAGNOSIS — G44209 Tension-type headache, unspecified, not intractable: Secondary | ICD-10-CM | POA: Diagnosis not present

## 2021-12-02 DIAGNOSIS — M542 Cervicalgia: Secondary | ICD-10-CM

## 2021-12-02 MED ORDER — AMITRIPTYLINE HCL 25 MG PO TABS: 25.0000 mg | ORAL_TABLET | Freq: Every day | ORAL | 11 refills | Status: DC

## 2021-12-02 NOTE — Patient Instructions (Addendum)
Your Plan:  Recommend stopping the topamax as this has given you no benefit  We will try amitriptyline 25mg  nightly - after 1 week if no benefit, we can consider increasing dose  You will be called to start PT for dry needling and neck strengthening exercises   Use moist heat and gentle neck stretching for neck pain. Ensure good posture especially when sitting at computer   Please speak with your school counselor or advisor to see what options you would have regarding your classes. I do believe this pain will improve working with therapies but also important to reduce stressors.   Please ensure you follow up with your OBGYN regarding continued heavy menstrual bleeding and cramping        Thank you for coming to see at [facility]. I hope we have been able to provide you high quality care today.  You may receive a patient satisfaction survey over the next few weeks. We would appreciate your feedback and comments so that we may continue to improve ourselves and the health of our patients.

## 2021-12-02 NOTE — Progress Notes (Signed)
[facility] [address] [place]. [place] [address] [phone]       OFFICE FOLLOW UP NOTE  [name] Date of Birth:  Jun 30, 2001 Medical Record Number:  [id number]    Primary neurologist: Dr. Pearlean Brownie Reason for Referral: CVST  Reason for visit: headaches    SUBJECTIVE:   CHIEF COMPLAINT:  Chief Complaint  Patient presents with   Follow-up    RM 3 alone Pt is well, here for reoccurring headaches.     HPI:   Update 12/02/2021 JM: Patient returns for acute visit due to reoccurring headaches.  Previously seen 11/1 with reported resolution of headaches but called office on 11/28 with reoccurring headaches - repeated CTV which showed resolution of prior CVST. Started on topiramate and currently taking 50 mg twice daily but continues to experience frequent headaches. Reports no benefit with topamax and causes fatigue. C/o left sided neck pain/tightness from back of ear to upper trap. Can have tightness sensation to the right shoulder and into mid/lower trapezius. Denies other areas of head pain. Feels like she cannot move her neck from side to side completely. Can worsen with increased stress or after sitting at computer for too long. Will take tylenol with some benefit. Typically worse upon awakening and towards the evening.  At first, denied increased stressors currently but then requested a note for school requesting that some of her classes to transition to online to help decrease stress load as she is feeling very overwhelmed taking numerous classes. She has remained on Eliquis 5 mg twice daily. Continues to experience heavy menstrual periods and cramping. Has not yet seen OBGYN.      History provided for reference purposes only Initial visit 09/07/2021 JM: Being seen for initial hospital follow-up unaccompanied.  She reports initially experiencing daily headaches for about 2 weeks after discharge but these have since resolved.  Left-sided sensory deficit  resolved.  Denies new or reoccurring neurological symptoms.  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 Use of Excedrin with resolution. More recent headaches different from typical migraine headaches.  Remains on Eliquis 5 mg twice daily - has been experiencing heavy menstrual cycles which are lasting 5-7 days in during (has had 2 cycles since discharge). She has not yet been seen by OB/GYN. She did stop OCP. Denies hx of heavy menstrual cycles even when not on BC.   No further concerns at this time   Hospitalization 07/20/2021 Rebecca Beltran is a 21 y.o. female with past medical history significant for bilateral knee dislocations, oral contraceptive use, and migraine headaches, who presented on 07/20/2021 with left-sided severe headache associated with nausea, vomiting, light sensitivity, sound sensitivity and subsequently paresthesias to left arm and leg and double vision that developed while in ED.  Personally reviewed hospitalization pertinent progress notes, lab work and imaging.  Evaluated by Dr. Pearlean Brownie.  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  MR brain no acute abnormality.  Of note, OCP recently changed continue estrogen.  Also recently completed prednisone course 2 days prior for knee dislocation pain.  Migraine headaches less than once a month.  Placed on heparin drip and transition to apixaban 5 mg twice daily for at least 8-9  months.  Recommended follow-up MRV 4-6 months for surveillance monitoring.  OCP discontinued and advised follow-up with OB/GYN for further management. Headache greatly improved prior to discharge.  A1c 4.8.  LDL 66.  Residual sensory impairment to left face and arm.  Discharged home on 07/22/2021.        PERTINENT IMAGING  CT VENOGRAM HEAD  07/20/2021 IMPRESSION: 1. Occlusive thrombus within the lateral left  transverse sinus with suspected thrombosis of an adjacent cortical vein. 2. No enhancement within the left sigmoid sinus or internal jugular vein.  10/12/2021 IMPRESSION 1. No evidence of venous sinus thrombosis. Previously noted thrombus in the left transverse sinus, sigmoid sinus, and internal jugular vein are no longer seen. 2. No acute intracranial process.    MR BRAIN WO CONTRAST 07/21/2021 IMPRESSION: 1. No acute intracranial abnormality. 2. Abnormal flow void of the left transverse sinus, sigmoid sinus and internal jugular vein, consistent with known thrombosis. 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      ROS:   14 system review of systems performed and negative with exception of those listed in HPI  PMH: History reviewed. No pertinent past medical history.  PSH: History reviewed. No pertinent surgical history.  Social History:  Social History   Socioeconomic History   Marital status: Single    Spouse name: Not on file   Number of children: Not on file   Years of education: Not on file   Highest education level: Not on file  Occupational History   Not on file  Tobacco Use   Smoking status: Not on file   Smokeless tobacco: Not on file  Substance and Sexual Activity   Alcohol use: Not on file   Drug use: Not on file   Sexual activity: Not on file  Other Topics Concern   Not on file  Social History Narrative   Not on file   Social Determinants of Health   Financial Resource Strain: Not on file  Food Insecurity: Not on file  Transportation Needs: Not on file  Physical Activity: Not on file  Stress: Not on file  Social Connections: Not on file  Intimate Partner Violence: Not on file    Family History: History reviewed. No pertinent family history.  Medications:   Current Outpatient Medications on File Prior to Visit  Medication Sig Dispense Refill   apixaban (ELIQUIS) 5 MG  TABS tablet Take 1 tablet (5 mg total) by mouth 2 (two) times daily. 180 tablet 0   No current facility-administered medications on file prior to visit.    Allergies:  Not on File    OBJECTIVE:  Physical Exam  Vitals:   12/02/21 0841  BP: 115/78  Pulse: 90  Weight: 135 lb (61.2 kg)  Height: 5\' 7"  (1.702 m)    Body mass index is 21.14 kg/m. No results found.  General: well developed, well nourished, young very pleasant African-American female, seated, in no evident distress Head: head normocephalic and atraumatic.   Neck: supple with no carotid or supraclavicular bruits Cardiovascular: regular rate and rhythm, no murmurs Musculoskeletal: no deformity; full neck ROM, tightness noted in b/l sternocleidomastoid and upper traps  Skin:  no rash/petichiae Vascular:  Normal pulses all extremities   Neurologic Exam Mental Status: Awake and fully alert.  Fluent speech and language.  Oriented to place and time. Recent and remote memory intact. Attention span, concentration and fund of knowledge appropriate. Mood and affect appropriate.  Cranial Nerves: Pupils equal, briskly reactive to light. Extraocular movements full without nystagmus. Visual fields full to confrontation. Hearing intact. Facial sensation intact. Face, tongue, palate moves normally and symmetrically.  Motor: Normal bulk and  tone. Normal strength in all tested extremity muscles Sensory.: intact to touch , pinprick , position and vibratory sensation.  Coordination: Rapid alternating movements normal in all extremities. Finger-to-nose and heel-to-shin performed accurately bilaterally. Gait and Station: Arises from chair without difficulty. Stance is normal. Gait demonstrates normal stride length and balance without use of AD. Tandem walk and heel toe without difficulty.  Reflexes: 1+ and symmetric. Toes downgoing.          ASSESSMENT: Rebecca Beltran is a 21 y.o. year old female with recent CVST likely in setting of  OCP on 07/20/2021. Vascular risk factors include oral contraceptive use and migraine headaches. Acute visit for worsening headaches     PLAN:  New onset headaches: onset 11/28. Repeat CVT 12/6 showed resolution of CVST. Per report, appears more tension related. Hx of migraine headaches. Currently headaches more likely tension type from cervical strain likely due to increased stressors and poor posture. Stop topiramate as no benefit and causing fatigue. Try amitriptyline 25 mg nightly and referral to PT for dry needling and neck strengthening and stretching exercises.  Discussed use of moist heat, gentle neck stretching and occasional use of Tylenol as needed.  Did discuss overuse of Tylenol can cause rebound headache. No reg flags or concerning findings on exam. Advised to speak with school counselor/advisor regarding her current classes causing increased stress but suspect improvement of pain with PT CVST : repeat CVT 12/6 resoluation of CVST.  Continue Eliquis until May/June to complete recommended 8-9 mo of therapy OCP use: advised again to f/u with OB/GYN to discuss other contraceptive methods. Avoid estrogen in setting of recent CVST as well as known aura migraines. Reports heavy menstrual cycles and worsening cramping since stopping OCP    Follow up in 4 months or call earlier if needed   CC:  [facility] provider: Dr. Pearlean BrownieSethi  I spent 38 minutes of face-to-face and non-face-to-face time with patient.  This included previsit chart review, lab review, study review, order entry, electronic health record documentation, patient education regarding new c/o of tension type headache and cervical neck pain, possible etiologies and treatment plan, hx of CVST, ongoing use of Eliquis, prior  OCP use and heavy menstrual cycles and answered all other questions to patient satisfaction   Ihor AustinJessica McCue, AGNP-BC  [facility] [address] [place], [place] [address]  Phone  [phone] Fax [phone] Note: This document was prepared with digital dictation and possible smart phrase technology. Any transcriptional errors that result from this process are unintentional.

## 2021-12-15 ENCOUNTER — Encounter: Payer: Self-pay | Admitting: Adult Health

## 2021-12-20 ENCOUNTER — Encounter: Payer: Self-pay | Admitting: Adult Health

## 2021-12-28 NOTE — Therapy (Incomplete)
OUTPATIENT PHYSICAL THERAPY CERVICAL EVALUATION   Patient Name: Rebecca Beltran MRN: [id number] DOB:Jan 05, 2001, 21 y.o., female Today's Date: 12/28/2021    No past medical history on file. No past surgical history on file. 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  ONSET DATE: ***  SUBJECTIVE:                                                                                                                                                                                                         SUBJECTIVE STATEMENT: ***  PERTINENT HISTORY:  ***  PAIN:  Are you having pain? {yes/no:20286} NPRS scale: ***/10 Pain location: *** Pain orientation: {Pain Orientation:25161}  PAIN TYPE: {type:313116} Pain description: {PAIN DESCRIPTION:21022940}  Aggravating factors: *** Relieving factors: ***  PRECAUTIONS: {Therapy precautions:24002}  WEIGHT BEARING RESTRICTIONS {Yes ***/No:24003}  FALLS:  Has patient fallen in last 6 months? {yes/no:20286} Number of falls: ***  LIVING ENVIRONMENT: Lives with: {[facility] lives with:25569::"lives with their family"} Lives in: {Lives in:[address]} Stairs: {yes/no:20286}; {Stairs:24000} Has following equipment at home: {Assistive devices:23999}  OCCUPATION: ***  PLOF: {PLOF:24004}  PATIENT GOALS ***  OBJECTIVE:   DIAGNOSTIC FINDINGS:  ***  PATIENT SURVEYS:  {rehab surveys:24030}   COGNITION: Overall cognitive status: {cognition:24006}   SENSATION: Light touch: {intact/deficits:24005} Hot/Cold: {intact/deficits:24005} Proprioception: {intact/deficits:24005}  POSTURE:  ***  PALPATION: ***   CERVICAL AROM/PROM  A/PROM A/PROM (deg) 12/28/2021  Flexion   Extension   Right lateral  flexion   Left lateral flexion   Right rotation   Left rotation    (Blank rows = not tested)  UE AROM/PROM:  A/PROM Right 12/28/2021 Left 12/28/2021  Shoulder flexion    Shoulder extension    Shoulder abduction    Shoulder adduction    Shoulder extension    Shoulder internal rotation    Shoulder external rotation    Elbow flexion    Elbow extension    Wrist flexion    Wrist extension    Wrist ulnar deviation    Wrist radial deviation    Wrist pronation    Wrist supination     (Blank rows = not tested)  UE MMT:  MMT Right 12/28/2021 Left 12/28/2021  Shoulder flexion    Shoulder extension    Shoulder abduction    Shoulder adduction    Shoulder extension  Shoulder internal rotation    Shoulder external rotation    Middle trapezius    Lower trapezius    Elbow flexion    Elbow extension    Wrist flexion    Wrist extension    Wrist ulnar deviation    Wrist radial deviation    Wrist pronation    Wrist supination    Grip strength     (Blank rows = not tested)  CERVICAL SPECIAL TESTS:  {Cervical special tests:25246}   FUNCTIONAL TESTS:  {Functional tests:24029}    TODAY'S TREATMENT:  ***   PATIENT EDUCATION:  Education details: *** Person educated: {Person educated:25204} Education method: {Education Method:25205} Education comprehension: {Education Comprehension:25206}   HOME EXERCISE PROGRAM: ***  ASSESSMENT:  CLINICAL IMPRESSION: Patient is a *** y.o. *** who was seen today for physical therapy evaluation and treatment for ***.    OBJECTIVE IMPAIRMENTS {opptimpairments:25111}.   ACTIVITY LIMITATIONS {activity limitations:25113}.   PERSONAL FACTORS {Personal factors:25162} are also affecting patient's functional outcome.    REHAB POTENTIAL: {rehabpotential:25112}  CLINICAL DECISION MAKING: {clinical decision making:25114}  EVALUATION COMPLEXITY: {Evaluation complexity:25115}   GOALS: Goals reviewed with patient?  {yes/no:20286}  SHORT TERM GOALS:  STG Name Target Date Goal status  1 *** Baseline:  {follow up:25551} {GOALSTATUS:25110}  2 *** Baseline:  {follow up:25551} {GOALSTATUS:25110}  3 *** Baseline: {follow up:25551} {GOALSTATUS:25110}  4 *** Baseline: {follow up:25551} {GOALSTATUS:25110}  5 *** Baseline: {follow up:25551} {GOALSTATUS:25110}  6 *** Baseline: {follow up:25551} {GOALSTATUS:25110}  7 *** Baseline: {follow up:25551} {GOALSTATUS:25110}   LONG TERM GOALS:   LTG Name Target Date Goal status  1 *** Baseline: {follow up:25551} {GOALSTATUS:25110}  2 *** Baseline: {follow up:25551} {GOALSTATUS:25110}  3 *** Baseline: {follow up:25551} {GOALSTATUS:25110}  4 *** Baseline: {follow up:25551} {GOALSTATUS:25110}  5 *** Baseline: {follow up:25551} {GOALSTATUS:25110}  6 *** Baseline: {follow up:25551} {GOALSTATUS:25110}  7 *** Baseline: {follow up:25551} {GOALSTATUS:25110}   PLAN: PT FREQUENCY: {rehab frequency:25116}  PT DURATION: {rehab duration:25117}  PLANNED INTERVENTIONS: {rehab planned interventions:25118::"Therapeutic exercises","Therapeutic activity","Neuro Muscular re-education","Balance training","Gait training","Patient/Family education","Joint mobilization"}  PLAN FOR NEXT SESSION: Ward Chatters, PT 12/28/2021, 11:09 AM

## 2021-12-29 ENCOUNTER — Other Ambulatory Visit: Payer: Self-pay

## 2021-12-29 ENCOUNTER — Ambulatory Visit: Payer: No Typology Code available for payment source | Attending: Adult Health

## 2021-12-29 DIAGNOSIS — M6281 Muscle weakness (generalized): Secondary | ICD-10-CM | POA: Insufficient documentation

## 2021-12-29 DIAGNOSIS — G44209 Tension-type headache, unspecified, not intractable: Secondary | ICD-10-CM | POA: Insufficient documentation

## 2021-12-29 DIAGNOSIS — M542 Cervicalgia: Secondary | ICD-10-CM | POA: Diagnosis not present

## 2021-12-29 NOTE — Therapy (Signed)
OUTPATIENT PHYSICAL THERAPY CERVICAL EVALUATION   Patient Name: Rebecca Beltran MRN: [id number] DOB:09-06-2001, 21 y.o., female Today's Date: 12/29/2021   PT End of Session - 12/29/21 1421     Visit Number 1    Number of Visits 16    Date for PT Re-Evaluation 02/23/22    Authorization Type [employer]    PT Start Time 1138   arrived late   PT Stop Time 1208    PT Time Calculation (min) 30 min    Activity Tolerance Patient tolerated treatment well    Behavior During Therapy [facility] for tasks assessed/performed             History reviewed. No pertinent past medical history. History reviewed. No pertinent surgical history. 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 No MOI, pain has been gradually increasing for last 3 months, increases with stress. Had a CVA last fall, no residual deficits but continues to follow up with neurologist and is on blood thinners. Is a student at [employer] and has increased pain when studying or is on her computer for prolonged periods.   PERTINENT HISTORY:  Idiopathic CVST  PAIN:  Are you having pain? No NPRS scale: 0/10 (7/10 at worst) Pain location: L upper trap PAIN TYPE: sharp and tight Pain description: intermittent  Aggravating factors: studying, stress Relieving factors: Heat  PRECAUTIONS: None  WEIGHT BEARING RESTRICTIONS No  FALLS:  Has patient  fallen in last 6 months? No Number of falls: N/A  LIVING ENVIRONMENT: Lives with: lives with their family Lives in: House/apartment Stairs: Yes; No barriers Has following equipment at home: None  OCCUPATION: Student  PLOF: Independent and Independent with basic ADLs  PATIENT GOALS: Pt wants to decrease neck and headaches   OBJECTIVE:   DIAGNOSTIC FINDINGS:  N/A  PATIENT SURVEYS:  FOTO 52% function; 72% predicted   COGNITION: Overall cognitive status: Within functional limits for tasks assessed   SENSATION: Light touch: Appears intact  POSTURE:  Small body habitus, fwd head, rounded shoulders  PALPATION: TTP to L upper trap   CERVICAL AROM/PROM  A/PROM A/PROM (deg) 12/29/2021  Right rotation 65  Left rotation 55   (Blank rows = not tested)  UE MMT:  MMT Right 12/29/2021 Left 12/29/2021  Middle trapezius 3/5 3/5  Lower trapezius 3/5 3/5   (Blank rows = not tested)  CERVICAL SPECIAL TESTS:  Neck flexor muscle endurance test: 10 seconds   FUNCTIONAL TESTS:  N/A  TODAY'S TREATMENT:  [facility] Adult PT Treatment:                                                DATE: 12/29/2021 Therapeutic Exercise: Supine chin x 5 - 5" hold Row x 10 black TB L upper trap stretch x 30" Manual Therapy: Suboccipital release  L upper trap positional release  PATIENT EDUCATION:  Education details: eval findings, HEP, FOTO, POC Person educated: Patient Education method: Explanation, Demonstration, and Handouts Education comprehension: verbalized understanding and returned demonstration   HOME EXERCISE PROGRAM: Access Code: [email or web address] URL: [email or web address] Date: 12/29/2021 Prepared by: Edwinna Areola  Exercises Seated Upper Trapezius Stretch (Mirrored) - 1 x daily - 7 x weekly - 1 sets - 3 reps - 30 sec hold Standing Shoulder Row with Anchored Resistance - 1 x daily - 7 x weekly - 3 sets - 10 reps Supine Chin Tuck - 1 x daily - 7 x weekly - 2 sets - 10  reps - 5 sec hold   ASSESSMENT:  CLINICAL IMPRESSION: Patient is a 21 y.o. F who was seen today for physical therapy evaluation and treatment for neck pain and chronic headache. Physical findings are consistent with referring provider impression, as pt demonstrates decreased neck ROM and increased tone in upper trapezius and cervical paraspinals. She responded well to initial manual therapy interventions, noting decreased tightness post session. Pt would benefit from skilled PT services for improving DNF and periscapular strength as well as manual therapy interventions for decreasing pain and improving comfort.      OBJECTIVE IMPAIRMENTS decreased ROM, decreased strength, and pain.   ACTIVITY LIMITATIONS community activity and school work .   PERSONAL FACTORS 1 comorbidity: Idiopathic CVST  are also affecting patient's functional outcome.    REHAB POTENTIAL: Excellent  CLINICAL DECISION MAKING: Stable/uncomplicated  EVALUATION COMPLEXITY: Low   GOALS: Goals reviewed with patient? No  SHORT TERM GOALS:  STG Name Target Date Goal status  1 Pt will be compliant and knowledgeable with initial HEP for improved comfort and carryover Baseline: initial HEP given  01/19/2022 INITIAL   LONG TERM GOALS:   LTG Name Target Date Goal status  1 Pt will improve FOTO function score to no less than 72% as proxy for functional improvement Baseline: 56% function 02/23/2022 INITIAL  2 Pt will improve L cervical rotation to no less than 65 deg for improved comfort and function Baseline: 55 deg  02/23/2022 INITIAL  3 Pt will self report neck and L upper trap pain no greater than 1-2/10 at worst for improved comfort and functional ability Baseline: 7/10 at worst  02/23/2022 INITIAL  4 Pt will improve middle and lower trapezius strength to no less than 4/5 for improved strength and posture Baseline: 3/5 each 02/23/2022 INITIAL   PLAN: PT FREQUENCY: 1-2x/week  PT DURATION: 8 weeks  PLANNED  INTERVENTIONS: Therapeutic exercises, Therapeutic activity, Neuro  Muscular re-education, Balance training, Gait training, Patient/Family education, Joint mobilization, Dry Needling, Electrical stimulation, Cryotherapy, Moist heat, and Manual therapy  PLAN FOR NEXT SESSION: assess HEP response, DNF and periscapular strengthening, Possible TPDN?    Eloy End, PT 12/29/2021, 3:25 PM

## 2022-01-03 ENCOUNTER — Ambulatory Visit: Payer: No Typology Code available for payment source

## 2022-01-03 NOTE — Therapy (Incomplete)
OUTPATIENT PHYSICAL THERAPY TREATMENT NOTE   Patient Name: Rebecca Beltran MRN: [id number] DOB:20-Aug-2001, 21 y.o., female Today's Date: 01/03/2022  PCP: System, Provider Not In REFERRING PROVIDER: Ihor Austin, NP    No past medical history on file. No past surgical history on file. 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  PERTINENT HISTORY: Idiopathic CVST  PRECAUTIONS: None  SUBJECTIVE: ***  PAIN:  Are you having pain? {yes/no:20286} NPRS scale: ***/10 Pain location: L upper trap PAIN TYPE: sharp and tight Pain description: intermittent  Aggravating factors: studying, stress Relieving factors: Heat   OBJECTIVE:    DIAGNOSTIC FINDINGS:  N/A   PATIENT SURVEYS:  FOTO 52% function; 72% predicted     COGNITION: Overall cognitive status: Within functional limits for tasks assessed            SENSATION: Light touch: Appears intact   POSTURE:  Small body habitus, fwd head, rounded shoulders   PALPATION: TTP to L upper trap         CERVICAL AROM/PROM   A/PROM A/PROM (deg) 12/29/2021  Right rotation 65  Left rotation 55   (Blank rows = not tested)   UE MMT:   MMT Right 12/29/2021 Left 12/29/2021  Middle trapezius 3/5 3/5  Lower trapezius 3/5 3/5   (Blank rows = not tested)   CERVICAL SPECIAL TESTS:  Neck flexor muscle endurance test: 10 seconds     FUNCTIONAL TESTS:  N/A   TODAY'S TREATMENT:  [facility] Adult PT Treatment:                                                DATE: 01/03/2022 Therapeutic Exercise: Supine chin x 5 - 5" hold Row x 10 black TB L upper trap stretch x 30" Manual Therapy: Suboccipital release  L upper trap positional release  [facility] Adult PT Treatment:                                                DATE: 12/29/2021 Therapeutic Exercise: Supine  chin x 5 - 5" hold Row x 10 black TB L upper trap stretch x 30" Manual Therapy: Suboccipital release  L upper trap positional release   PATIENT EDUCATION:  Education details: eval findings, HEP, FOTO, POC Person educated: Patient Education method: Explanation, Demonstration, and Handouts Education comprehension: verbalized understanding and returned demonstration     HOME EXERCISE PROGRAM: Access Code: [email or web address] URL: [email or web address] Date: 12/29/2021 Prepared by: Edwinna Areola   Exercises Seated Upper Trapezius Stretch (Mirrored) - 1 x daily - 7 x weekly - 1 sets - 3 reps - 30 sec hold Standing Shoulder Row with Anchored Resistance - 1 x daily - 7 x weekly - 3 sets - 10 reps Supine Chin Tuck - 1 x daily - 7 x weekly - 2 sets - 10 reps - 5 sec hold     ASSESSMENT:   CLINICAL IMPRESSION: ***     OBJECTIVE IMPAIRMENTS decreased ROM, decreased strength, and pain.    ACTIVITY LIMITATIONS community activity and school work .    PERSONAL FACTORS 1  comorbidity: Idiopathic CVST  are also affecting patient's functional outcome.       GOALS: Goals reviewed with patient? No   SHORT TERM GOALS:   STG Name Target Date Goal status  1 Pt will be compliant and knowledgeable with initial HEP for improved comfort and carryover Baseline: initial HEP given  01/19/2022 INITIAL    LONG TERM GOALS:    LTG Name Target Date Goal status  1 Pt will improve FOTO function score to no less than 72% as proxy for functional improvement Baseline: 56% function 02/23/2022 INITIAL  2 Pt will improve L cervical rotation to no less than 65 deg for improved comfort and function Baseline: 55 deg  02/23/2022 INITIAL  3 Pt will self report neck and L upper trap pain no greater than 1-2/10 at worst for improved comfort and functional ability Baseline: 7/10 at worst  02/23/2022 INITIAL  4 Pt will improve middle and lower trapezius strength to no less than 4/5 for improved strength and  posture Baseline: 3/5 each 02/23/2022 INITIAL    PLAN: PT FREQUENCY: 1-2x/week   PT DURATION: 8 weeks   PLANNED INTERVENTIONS: Therapeutic exercises, Therapeutic activity, Neuro Muscular re-education, Balance training, Gait training, Patient/Family education, Joint mobilization, Dry Needling, Electrical stimulation, Cryotherapy, Moist heat, and Manual therapy   PLAN FOR NEXT SESSION: assess HEP response, DNF and periscapular strengthening, Possible TPDN?     Eloy End, PT 01/03/2022, 7:45 AM

## 2022-01-05 ENCOUNTER — Ambulatory Visit: Payer: No Typology Code available for payment source | Attending: Adult Health

## 2022-01-05 ENCOUNTER — Other Ambulatory Visit: Payer: Self-pay

## 2022-01-05 DIAGNOSIS — M6281 Muscle weakness (generalized): Secondary | ICD-10-CM | POA: Diagnosis present

## 2022-01-05 DIAGNOSIS — M542 Cervicalgia: Secondary | ICD-10-CM | POA: Diagnosis not present

## 2022-01-05 NOTE — Therapy (Signed)
OUTPATIENT PHYSICAL THERAPY TREATMENT NOTE   Patient Name: Rebecca Beltran MRN: [id number] DOB:[birth year], 21 y.o., female Today's Date: 01/05/2022  PCP: System, Provider Not In REFERRING PROVIDER: Ihor Austin, NP   PT End of Session - 01/05/22 1450     Visit Number 2    Number of Visits 16    Date for PT Re-Evaluation 02/23/22    Authorization Type [employer]    PT Start Time 1450    PT Stop Time 1530   2 minutes taken out for TPDN   PT Time Calculation (min) 40 min    Activity Tolerance Patient tolerated treatment well    Behavior During Therapy [facility] for tasks assessed/performed             History reviewed. No pertinent past medical history. History reviewed. No pertinent surgical history. 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   PAIN:  Are you having pain? Yes NPRS scale: 6/10 Pain location: L upper trap PAIN TYPE: sharp and tight Pain description: intermittent  Aggravating factors: studying, stress Relieving factors: Heat   OBJECTIVE:    PATIENT SURVEYS:  FOTO 52% function; 72% predicted     CERVICAL AROM/PROM   A/PROM A/PROM (deg) 12/29/2021  Right rotation 65  Left rotation 55   (Blank rows = not tested)   UE MMT:   MMT Right 12/29/2021 Left 12/29/2021  Middle trapezius 3/5 3/5  Lower trapezius 3/5 3/5   (Blank rows = not tested)   CERVICAL SPECIAL TESTS:  Neck flexor muscle endurance test: 10 seconds     FUNCTIONAL TESTS:  N/A   TODAY'S TREATMENT:  [facility] Adult PT Treatment:                                                DATE: 01/05/2022 Therapeutic Exercise: UBE lvl 1.0 x 3 min while taking  subjective Row 2x12 17# Serratus wall slide with soft foam x 10 YTB Seated row 2x10 25# Standing chin tuck against ball 2x10  Manual Therapy: Suboccipital release  L upper trap positional release Trigger Point Dry Needling Treatment: Pre-treatment instruction: Patient instructed on dry needling rationale, procedures, and possible side effects including pain during treatment (achy,cramping feeling), bruising, drop of blood, lightheadedness, nausea, sweating. Patient Consent Given: Yes Education handout provided: No Muscles treated: L upper trap  Needle size and number: .30x85mm x 3 Electrical stimulation performed: No Parameters: N/A Treatment response/outcome: Twitch response elicited Post-treatment instructions: Patient instructed to expect possible mild to moderate muscle soreness later today and/or tomorrow. Patient instructed in methods to reduce muscle soreness and to continue prescribed HEP. If patient was dry needled over the lung field, patient was instructed on signs and symptoms of pneumothorax and, however unlikely, to see immediate medical attention should they occur. Patient was also educated on signs and symptoms of infection and to seek medical attention should they occur. Patient verbalized understanding of these instructions and education.   [facility] Adult PT Treatment:  DATE: 12/29/2021 Therapeutic Exercise: Supine chin x 5 - 5" hold Row x 10 black TB L upper trap stretch x 30" Manual Therapy: Suboccipital release  L upper trap positional release   PATIENT EDUCATION:  Education details: eval findings, HEP, FOTO, POC Person educated: Patient Education method: Explanation, Demonstration, and Handouts Education comprehension: verbalized understanding and returned demonstration     HOME EXERCISE PROGRAM: Access Code: [email or web address] URL: [email or web address] Date: 12/29/2021 Prepared by: Edwinna Areola    Exercises Seated Upper Trapezius Stretch (Mirrored) - 1 x daily - 7 x weekly - 1 sets - 3 reps - 30 sec hold Standing Shoulder Row with Anchored Resistance - 1 x daily - 7 x weekly - 3 sets - 10 reps Supine Chin Tuck - 1 x daily - 7 x weekly - 2 sets - 10 reps - 5 sec hold     ASSESSMENT:   CLINICAL IMPRESSION: Pt was able to complete prescribed exercises with no adverse effect or increase in pain. Therapy today focused on improving periscapular and DNF strength in order to decrease pain. She responded well with manual therapy interventions, noting decreased pain post session. Pt is progressing as expected with therapy and will continue to be seen and progressed as tolerated.      OBJECTIVE IMPAIRMENTS decreased ROM, decreased strength, and pain.    ACTIVITY LIMITATIONS community activity and school work .    PERSONAL FACTORS 1 comorbidity: Idiopathic CVST  are also affecting patient's functional outcome.       GOALS: Goals reviewed with patient? No   SHORT TERM GOALS:   STG Name Target Date Goal status  1 Pt will be compliant and knowledgeable with initial HEP for improved comfort and carryover Baseline: initial HEP given  01/19/2022 INITIAL    LONG TERM GOALS:    LTG Name Target Date Goal status  1 Pt will improve FOTO function score to no less than 72% as proxy for functional improvement Baseline: 56% function 02/23/2022 INITIAL  2 Pt will improve L cervical rotation to no less than 65 deg for improved comfort and function Baseline: 55 deg  02/23/2022 INITIAL  3 Pt will self report neck and L upper trap pain no greater than 1-2/10 at worst for improved comfort and functional ability Baseline: 7/10 at worst  02/23/2022 INITIAL  4 Pt will improve middle and lower trapezius strength to no less than 4/5 for improved strength and posture Baseline: 3/5 each 02/23/2022 INITIAL    PLAN: PT FREQUENCY: 1-2x/week   PT DURATION: 8 weeks   PLANNED INTERVENTIONS: Therapeutic exercises,  Therapeutic activity, Neuro Muscular re-education, Balance training, Gait training, Patient/Family education, Joint mobilization, Dry Needling, Electrical stimulation, Cryotherapy, Moist heat, and Manual therapy   PLAN FOR NEXT SESSION: assess HEP response, DNF and periscapular strengthening, Possible TPDN?     Eloy End, PT 01/05/2022, 3:32 PM

## 2022-01-10 ENCOUNTER — Ambulatory Visit: Payer: No Typology Code available for payment source | Admitting: Adult Health

## 2022-01-10 ENCOUNTER — Ambulatory Visit: Payer: No Typology Code available for payment source

## 2022-01-10 NOTE — Therapy (Incomplete)
?OUTPATIENT PHYSICAL THERAPY TREATMENT NOTE ? ? ?Patient Name: Rebecca Beltran ?MRN: [id number] ?DOB:06-Jan-2001, 21 y.o., female ?Today's Date: 01/10/2022 ? ?PCP: System, Provider Not In ?REFERRING PROVIDER: Ihor Austin, NP ? ? ? ? ?No past medical history on file. ?No past surgical history on file. ?Patient Active Problem List  ? Diagnosis Date Noted  ? 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 ? ?REFERRING DIAG:  ?M54.2 (ICD-10-CM) - Cervicalgia ?G44.209 (ICD-10-CM) - Tension headache ? ?THERAPY DIAG:  ?No diagnosis found. ? ?PERTINENT HISTORY: Idiopathic CVST ? ?PRECAUTIONS: None ? ?SUBJECTIVE:  ?Pt presents to PT with reports of continued L upper trap and neck pain.  ? ?PAIN:  ?Are you having pain? Yes ?NPRS scale: 6/10 ?Pain location: L upper trap ?PAIN TYPE: sharp and tight ?Pain description: intermittent  ?Aggravating factors: studying, stress ?Relieving factors: Heat ? ? ?OBJECTIVE:  ?  ?PATIENT SURVEYS:  ?FOTO 52% function; 72% predicted ?  ?  ?CERVICAL AROM/PROM ?  ?A/PROM A/PROM (deg) ?12/29/2021  ?Right rotation 65  ?Left rotation 55  ? (Blank rows = not tested) ?  ?UE MMT: ?  ?MMT Right ?12/29/2021 Left ?12/29/2021  ?Middle trapezius 3/5 3/5  ?Lower trapezius 3/5 3/5  ? (Blank rows = not tested) ?  ?CERVICAL SPECIAL TESTS:  ?Neck flexor muscle endurance test: 10 seconds ?  ?  ?FUNCTIONAL TESTS:  ?N/A ?  ?TODAY'S TREATMENT:  ?[facility] Adult PT Treatment:                                                DATE: 01/10/2022 ?Therapeutic Exercise: ?UBE lvl 1.0 x 3 min while taking subjective ?Row 2x12 17# ?Serratus wall slide with soft foam x 10 YTB ?Seated row 2x10 25# ?Standing chin tuck against ball 2x10  ?Manual Therapy: ?Suboccipital release  ?L upper trap positional release ?Trigger Point Dry Needling Treatment: ?Pre-treatment instruction: Patient instructed on dry needling rationale, procedures, and possible side effects including pain during treatment (achy,cramping feeling), bruising, drop of blood, lightheadedness,  nausea, sweating. ?Patient Consent Given: Yes ?Education handout provided: No ?Muscles treated: L upper trap  ?Needle size and number: .30x50mm x 3 ?Electrical stimulation performed: No ?Parameters: N/A ?Treatment response/outcome: Twitch response elicited ?Post-treatment instructions: Patient instructed to expect possible mild to moderate muscle soreness later today and/or tomorrow. Patient instructed in methods to reduce muscle soreness and to continue prescribed HEP. If patient was dry needled over the lung field, patient was instructed on signs and symptoms of pneumothorax and, however unlikely, to see immediate medical attention should they occur. Patient was also educated on signs and symptoms of infection and to seek medical attention should they occur. Patient verbalized understanding of these instructions and education. ? ?[facility] Adult PT Treatment:                                                DATE: 01/05/2022 ?Therapeutic Exercise: ?UBE lvl 1.0 x 3 min while taking subjective ?Row 2x12 17# ?Serratus wall slide with soft foam x 10 YTB ?Seated row 2x10 25# ?Standing chin tuck against ball 2x10  ?Manual Therapy: ?Suboccipital release  ?L upper trap positional release ?Trigger Point Dry Needling Treatment: ?Pre-treatment instruction: Patient instructed on dry needling rationale, procedures, and possible side effects including pain  during treatment (achy,cramping feeling), bruising, drop of blood, lightheadedness, nausea, sweating. ?Patient Consent Given: Yes ?Education handout provided: No ?Muscles treated: L upper trap  ?Needle size and number: .30x42mm x 3 ?Electrical stimulation performed: No ?Parameters: N/A ?Treatment response/outcome: Twitch response elicited ?Post-treatment instructions: Patient instructed to expect possible mild to moderate muscle soreness later today and/or tomorrow. Patient instructed in methods to reduce muscle soreness and to continue prescribed HEP. If patient was dry needled over  the lung field, patient was instructed on signs and symptoms of pneumothorax and, however unlikely, to see immediate medical attention should they occur. Patient was also educated on signs and symptoms of infection and to seek medical attention should they occur. Patient verbalized understanding of these instructions and education. ? ?  ?PATIENT EDUCATION:  ?Education details: eval findings, HEP, FOTO, POC ?Person educated: Patient ?Education method: Explanation, Demonstration, and Handouts ?Education comprehension: verbalized understanding and returned demonstration ?  ?  ?HOME EXERCISE PROGRAM: ?Access Code: [email or web address] ?URL: [email or web address] ?Date: 12/29/2021 ?Prepared by: Edwinna Areola ?  ?Exercises ?Seated Upper Trapezius Stretch (Mirrored) - 1 x daily - 7 x weekly - 1 sets - 3 reps - 30 sec hold ?Standing Shoulder Row with Anchored Resistance - 1 x daily - 7 x weekly - 3 sets - 10 reps ?Supine Chin Tuck - 1 x daily - 7 x weekly - 2 sets - 10 reps - 5 sec hold ?  ?  ?ASSESSMENT: ?  ?CLINICAL IMPRESSION: ?*** ?  ?  ?OBJECTIVE IMPAIRMENTS decreased ROM, decreased strength, and pain.  ?  ?ACTIVITY LIMITATIONS community activity and school work .  ?  ?PERSONAL FACTORS 1 comorbidity: Idiopathic CVST  are also affecting patient's functional outcome.  ?  ?   ?GOALS: ?Goals reviewed with patient? No ?  ?SHORT TERM GOALS: ?  ?STG Name Target Date Goal status  ?1 Pt will be compliant and knowledgeable with initial HEP for improved comfort and carryover ?Baseline: initial HEP given  01/19/2022 INITIAL  ?  ?LONG TERM GOALS:  ?  ?LTG Name Target Date Goal status  ?1 Pt will improve FOTO function score to no less than 72% as proxy for functional improvement ?Baseline: 56% function 02/23/2022 INITIAL  ?2 Pt will improve L cervical rotation to no less than 65 deg for improved comfort and function ?Baseline: 55 deg  02/23/2022 INITIAL  ?3 Pt will self report neck and L upper trap pain no greater than 1-2/10 at worst  for improved comfort and functional ability ?Baseline: 7/10 at worst  02/23/2022 INITIAL  ?4 Pt will improve middle and lower trapezius strength to no less than 4/5 for improved strength and posture ?Baseline: 3/5 each 02/23/2022 INITIAL  ?  ?PLAN: ?PT FREQUENCY: 1-2x/week ?  ?PT DURATION: 8 weeks ?  ?PLANNED INTERVENTIONS: Therapeutic exercises, Therapeutic activity, Neuro Muscular re-education, Balance training, Gait training, Patient/Family education, Joint mobilization, Dry Needling, Electrical stimulation, Cryotherapy, Moist heat, and Manual therapy ?  ?PLAN FOR NEXT SESSION: assess HEP response, DNF and periscapular strengthening, Possible TPDN?  ? ? ? ?Eloy End, PT ?01/10/2022, 7:42 AM ? ?   ?

## 2022-01-17 ENCOUNTER — Ambulatory Visit: Payer: No Typology Code available for payment source

## 2022-01-17 NOTE — Therapy (Incomplete)
?OUTPATIENT PHYSICAL THERAPY TREATMENT NOTE ? ? ?Patient Name: Rebecca Beltran ?MRN: [id number] ?DOB:May 15, 2001, 21 y.o., female ?Today's Date: 01/17/2022 ? ?PCP: System, Provider Not In ?REFERRING PROVIDER: Ihor Austin, NP ? ? ? ? ?No past medical history on file. ?No past surgical history on file. ?Patient Active Problem List  ? Diagnosis Date Noted  ? 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 ? ?REFERRING DIAG:  ?M54.2 (ICD-10-CM) - Cervicalgia ?G44.209 (ICD-10-CM) - Tension headache ? ?THERAPY DIAG:  ?No diagnosis found. ? ?PERTINENT HISTORY: Idiopathic CVST ? ?PRECAUTIONS: None ? ?SUBJECTIVE:  ?*** ? ?PAIN:  ?Are you having pain? Yes ?NPRS scale: 6/10 ?Pain location: L upper trap ?PAIN TYPE: sharp and tight ?Pain description: intermittent  ?Aggravating factors: studying, stress ?Relieving factors: Heat ? ? ?OBJECTIVE:  ?  ?PATIENT SURVEYS:  ?FOTO 52% function; 72% predicted ?  ?  ?CERVICAL AROM/PROM ?  ?A/PROM A/PROM (deg) ?12/29/2021  ?Right rotation 65  ?Left rotation 55  ? (Blank rows = not tested) ?  ?UE MMT: ?  ?MMT Right ?12/29/2021 Left ?12/29/2021  ?Middle trapezius 3/5 3/5  ?Lower trapezius 3/5 3/5  ? (Blank rows = not tested) ?  ?CERVICAL SPECIAL TESTS:  ?Neck flexor muscle endurance test: 10 seconds ?  ?  ?FUNCTIONAL TESTS:  ?N/A ?  ?TODAY'S TREATMENT:  ?[facility] Adult PT Treatment:                                                DATE: 01/17/2022 ?Therapeutic Exercise: ?UBE lvl 1.0 x 3 min while taking subjective ?Beltran 2x12 17# ?Serratus wall slide with soft foam x 10 YTB ?Seated Beltran 2x10 25# ?Standing chin tuck against ball 2x10  ?Manual Therapy: ?Suboccipital release  ?L upper trap positional release ?Trigger Point Dry Needling Treatment: ?Pre-treatment instruction: Patient instructed on dry needling rationale, procedures, and possible side effects including pain during treatment (achy,cramping feeling), bruising, drop of blood, lightheadedness, nausea, sweating. ?Patient Consent Given: Yes ?Education handout  provided: No ?Muscles treated: L upper trap  ?Needle size and number: .30x14mm x 3 ?Electrical stimulation performed: No ?Parameters: N/A ?Treatment response/outcome: Twitch response elicited ?Post-treatment instructions: Patient instructed to expect possible mild to moderate muscle soreness later today and/or tomorrow. Patient instructed in methods to reduce muscle soreness and to continue prescribed HEP. If patient was dry needled over the lung field, patient was instructed on signs and symptoms of pneumothorax and, however unlikely, to see immediate medical attention should they occur. Patient was also educated on signs and symptoms of infection and to seek medical attention should they occur. Patient verbalized understanding of these instructions and education. ? ?[facility] Adult PT Treatment:                                                DATE: 01/05/2022 ?Therapeutic Exercise: ?UBE lvl 1.0 x 3 min while taking subjective ?Beltran 2x12 17# ?Serratus wall slide with soft foam x 10 YTB ?Seated Beltran 2x10 25# ?Standing chin tuck against ball 2x10  ?Manual Therapy: ?Suboccipital release  ?L upper trap positional release ?Trigger Point Dry Needling Treatment: ?Pre-treatment instruction: Patient instructed on dry needling rationale, procedures, and possible side effects including pain during treatment (achy,cramping feeling), bruising, drop of blood, lightheadedness, nausea, sweating. ?Patient Consent Given:  Yes ?Education handout provided: No ?Muscles treated: L upper trap  ?Needle size and number: .30x37mm x 3 ?Electrical stimulation performed: No ?Parameters: N/A ?Treatment response/outcome: Twitch response elicited ?Post-treatment instructions: Patient instructed to expect possible mild to moderate muscle soreness later today and/or tomorrow. Patient instructed in methods to reduce muscle soreness and to continue prescribed HEP. If patient was dry needled over the lung field, patient was instructed on signs and symptoms of  pneumothorax and, however unlikely, to see immediate medical attention should they occur. Patient was also educated on signs and symptoms of infection and to seek medical attention should they occur. Patient verbalized understanding of these instructions and education. ? ?  ?PATIENT EDUCATION:  ?Education details: eval findings, HEP, FOTO, POC ?Person educated: Patient ?Education method: Explanation, Demonstration, and Handouts ?Education comprehension: verbalized understanding and returned demonstration ?  ?  ?HOME EXERCISE PROGRAM: ?Access Code: [email or web address] ?URL: [email or web address] ?Date: 12/29/2021 ?Prepared by: Edwinna Areola ?  ?Exercises ?Seated Upper Trapezius Stretch (Mirrored) - 1 x daily - 7 x weekly - 1 sets - 3 reps - 30 sec hold ?Standing Shoulder Beltran with Anchored Resistance - 1 x daily - 7 x weekly - 3 sets - 10 reps ?Supine Chin Tuck - 1 x daily - 7 x weekly - 2 sets - 10 reps - 5 sec hold ?  ?  ?ASSESSMENT: ?  ?CLINICAL IMPRESSION: ?*** ?  ?  ?OBJECTIVE IMPAIRMENTS decreased ROM, decreased strength, and pain.  ?  ?ACTIVITY LIMITATIONS community activity and school work .  ?  ?PERSONAL FACTORS 1 comorbidity: Idiopathic CVST  are also affecting patient's functional outcome.  ?  ?   ?GOALS: ?Goals reviewed with patient? No ?  ?SHORT TERM GOALS: ?  ?STG Name Target Date Goal status  ?1 Pt will be compliant and knowledgeable with initial HEP for improved comfort and carryover ?Baseline: initial HEP given  01/19/2022 INITIAL  ?  ?LONG TERM GOALS:  ?  ?LTG Name Target Date Goal status  ?1 Pt will improve FOTO function score to no less than 72% as proxy for functional improvement ?Baseline: 56% function 02/23/2022 INITIAL  ?2 Pt will improve L cervical rotation to no less than 65 deg for improved comfort and function ?Baseline: 55 deg  02/23/2022 INITIAL  ?3 Pt will self report neck and L upper trap pain no greater than 1-2/10 at worst for improved comfort and functional ability ?Baseline: 7/10 at  worst  02/23/2022 INITIAL  ?4 Pt will improve middle and lower trapezius strength to no less than 4/5 for improved strength and posture ?Baseline: 3/5 each 02/23/2022 INITIAL  ?  ?PLAN: ?PT FREQUENCY: 1-2x/week ?  ?PT DURATION: 8 weeks ?  ?PLANNED INTERVENTIONS: Therapeutic exercises, Therapeutic activity, Neuro Muscular re-education, Balance training, Gait training, Patient/Family education, Joint mobilization, Dry Needling, Electrical stimulation, Cryotherapy, Moist heat, and Manual therapy ?  ?PLAN FOR NEXT SESSION: assess HEP response, DNF and periscapular strengthening, Possible TPDN?  ? ? ? ?Eloy End, PT ?01/17/2022, 8:42 AM ? ?   ?

## 2022-02-04 ENCOUNTER — Encounter: Payer: Self-pay | Admitting: Adult Health

## 2022-04-13 ENCOUNTER — Encounter: Payer: Self-pay | Admitting: Adult Health

## 2022-04-13 ENCOUNTER — Ambulatory Visit: Payer: No Typology Code available for payment source | Admitting: Adult Health

## 2022-04-13 VITALS — BP 128/83 | HR 96 | Ht 68.0 in | Wt 150.0 lb

## 2022-04-13 DIAGNOSIS — Z86718 Personal history of other venous thrombosis and embolism: Secondary | ICD-10-CM

## 2022-04-13 NOTE — Progress Notes (Addendum)
[facility] [address] [place]. [place] [id number] [phone]       OFFICE FOLLOW UP NOTE  Ms. Rebecca CleaverAniah Beltran Date of Birth:  May 30, 2001 Medical Record Number:  [id number]    Primary neurologist: Dr. Pearlean BrownieSethi Reason for Referral: CVST  Reason for visit: Follow-up    SUBJECTIVE:   CHIEF COMPLAINT:  Chief Complaint  Patient presents with   Follow-up    RM 3 alone Pt is well and stable, states she really hasn't had any headaches recently     HPI:   Update 04/13/2022 JM: Patient returns for 5367-month follow-up unaccompanied.  She has been doing well since prior visit.  Reports resolution of headaches since April and therefore self discontinued amitriptyline around that time.  She has not had any additional headaches.  She has not had any new neurological symptoms.  She has remained on Eliquis, continues to have heavy menstrual cycle but otherwise denies side effects.  She has now completed 9 months of AC therapy.  She has remained off estrogen OCP. Reports being seen by her OBGYN who did not have any other recommendations for contraceptive methods.  Patient does not feel she needs contraception at this time.  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  No new concerns at this time.    History provided for reference purposes only Update 12/02/2021 JM: Patient returns for acute visit due to reoccurring headaches.  Previously seen 11/1 with reported resolution of headaches but called office on 11/28 with reoccurring headaches - repeated CTV which showed resolution of prior CVST. Started on topiramate and currently taking 50 mg twice daily but continues to experience frequent headaches. Reports no benefit with topamax and causes fatigue. C/o left sided neck pain/tightness from back of ear to upper trap. Can have tightness sensation to the right shoulder and into mid/lower trapezius.  Denies other areas of head pain. Feels like she cannot move her neck from side to side completely. Can worsen with increased stress or after sitting at computer for too long. Will take tylenol with some benefit. Typically worse upon awakening and towards the evening.  At first, denied increased stressors currently but then requested a note for school requesting that some of her classes to transition to online to help decrease stress load as she is feeling very overwhelmed taking numerous classes. She has remained on Eliquis 5 mg twice daily. Continues to experience heavy menstrual periods and cramping. Has not yet seen OBGYN.    Initial visit 09/07/2021 JM: Being seen for initial hospital follow-up unaccompanied.  She reports initially experiencing daily headaches for about 2 weeks after discharge but these have since resolved.  Left-sided sensory deficit resolved.  Denies new or reoccurring neurological symptoms.  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 Use of Excedrin with resolution. More recent headaches different from typical migraine headaches.  Remains on Eliquis 5 mg twice daily - has been experiencing heavy menstrual cycles which are lasting 5-7 days in during (has had 2 cycles since discharge). She has not yet been seen by OB/GYN. She did stop OCP. Denies hx of heavy menstrual cycles even when not on BC.   No further concerns at this time   Hospitalization 07/20/2021 Rebecca Beltran is a 21 y.o. female with past medical history significant for bilateral knee dislocations, oral contraceptive use, and migraine headaches, who presented on  07/20/2021 with left-sided severe headache associated with nausea, vomiting, light sensitivity, sound sensitivity and subsequently paresthesias to left arm and leg and double vision that developed while in ED.  Personally reviewed hospitalization pertinent progress notes, lab work and  imaging.  Evaluated by Dr. Pearlean Brownie.  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  MR brain no acute abnormality.  Of note, OCP recently changed continue estrogen.  Also recently completed prednisone course 2 days prior for knee dislocation pain.  Migraine headaches less than once a month.  Placed on heparin drip and transition to apixaban 5 mg twice daily for at least 8-9  months.  Recommended follow-up MRV 4-6 months for surveillance monitoring.  OCP discontinued and advised follow-up with OB/GYN for further management. Headache greatly improved prior to discharge.  A1c 4.8.  LDL 66.  Residual sensory impairment to left face and arm.  Discharged home on 07/22/2021.        PERTINENT IMAGING  CT VENOGRAM HEAD  07/20/2021 IMPRESSION: 1. Occlusive thrombus within the lateral left transverse sinus with suspected thrombosis of an adjacent cortical vein. 2. No enhancement within the left sigmoid sinus or internal jugular vein.  10/12/2021 IMPRESSION 1. No evidence of venous sinus thrombosis. Previously noted thrombus in the left transverse sinus, sigmoid sinus, and internal jugular vein are no longer seen. 2. No acute intracranial process.    MR BRAIN WO CONTRAST 07/21/2021 IMPRESSION: 1. No acute intracranial abnormality. 2. Abnormal flow void of the left transverse sinus, sigmoid sinus and internal jugular vein, consistent with known thrombosis. 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      ROS:   14 system review of systems performed and negative with exception of those listed in HPI  PMH: History reviewed. No pertinent past medical history.  PSH: History reviewed. No pertinent surgical history.  Social History:  Social History   Socioeconomic History   Marital status: Single    Spouse name: Not on file   Number of children: Not on file    Years of education: Not on file   Highest education level: Not on file  Occupational History   Not on file  Tobacco Use   Smoking status: Not on file   Smokeless tobacco: Not on file  Substance and Sexual Activity   Alcohol use: Not on file   Drug use: Not on file   Sexual activity: Not on file  Other Topics Concern   Not on file  Social History Narrative   Not on file   Social Determinants of Health   Financial Resource Strain: Not on file  Food Insecurity: Not on file  Transportation Needs: Not on file  Physical Activity: Not on file  Stress: Not on file  Social Connections: Not on file  Intimate Partner Violence: Not on file    Family History: History reviewed. No pertinent family history.  Medications:   Current Outpatient Medications on File Prior to Visit  Medication Sig Dispense Refill   amitriptyline (ELAVIL) 25 MG tablet Take 1 tablet (25 mg total) by mouth at bedtime. 30 tablet 11   apixaban (ELIQUIS) 5 MG TABS tablet Take 1 tablet (5 mg total) by mouth 2 (two) times daily. 180 tablet 0   No current facility-administered medications on file prior to visit.    Allergies:  Not on File    OBJECTIVE:  Physical Exam  Vitals:   04/13/22 1500  BP: 128/83  Pulse: 96  Weight: 150 lb (68 kg)  Height: 5\' 8"  (1.727 m)   Body mass index is 22.81 kg/m. No results found.   General: well developed, well nourished, young very pleasant African-American female, seated, in no evident distress Head: head normocephalic and atraumatic.   Neck: supple with no carotid or supraclavicular bruits Cardiovascular: regular rate and rhythm, no murmurs Musculoskeletal: no deformity Skin:  no rash/petichiae Vascular:  Normal pulses all extremities   Neurologic Exam Mental Status: Awake and fully alert.  Fluent speech and language.  Oriented to place and time. Recent and remote memory intact. Attention span, concentration and fund of knowledge appropriate. Mood and affect  appropriate.  Cranial Nerves: Pupils equal, briskly reactive to light. Extraocular movements full without nystagmus. Visual fields full to confrontation. Hearing intact. Facial sensation intact. Face, tongue, palate moves normally and symmetrically.  Motor: Normal bulk and tone. Normal strength in all tested extremity muscles Sensory.: intact to touch , pinprick , position and vibratory sensation.  Coordination: Rapid alternating movements normal in all extremities. Finger-to-nose and heel-to-shin performed accurately bilaterally. Gait and Station: Arises from chair without difficulty. Stance is normal. Gait demonstrates normal stride length and balance without use of AD. Tandem walk and heel toe without difficulty.  Reflexes: 1+ and symmetric. Toes downgoing.          ASSESSMENT/PLAN: Rebecca Beltran is a 21 y.o. 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 Vascular risk factors include oral estrogen contraceptive use and hx of aura migraine headaches.     CVST :  She has not had any further headaches since April CTV 07/2021 occlusive thrombus within the lateral left transverse sinus with suspected thrombosis of the adjacent cortical vein repeat CVT 10/2021 (d/t recurrence of headaches) resoluation of CVST.   Completed 9 months of Eliquis Will reach out to Dr. 11/2021 for further recommendations regarding stopping Eliquis and if any further evaluation/imaging/labs needed prior or if she should be transitioning to aspirin ADDENDUM: Further discussed plan with Dr. Pearlean Brownie.  Okay to stop Eliquis at this time as 9 months completed.  She will transition to aspirin 81 mg daily for secondary stroke prevention measures.  No indication for repeat imaging as prior imaging showed resolution and has not had any additional headaches or recurrence of headaches.  No indication for any repeat lab work.  It is still recommended to avoid estrogen containing OCP.  She was notified of these  recommendations via MyChart as discussed during her visit. OCP use: Encouraged to continue to avoid estrogen now with hx of CVST as well as hx of aura migraines.  Encouraged her to follow back up with OB/GYN or obtain a second opinion regarding contraceptive methods if needed in the future    As she has been stable, she can follow-up on an as-needed basis at this time.    CC:  [facility] provider: Dr. Pearlean Brownie  I spent 31 minutes of face-to-face and non-face-to-face time with patient.  This included previsit chart review, lab review, study review, electronic health record documentation, patient education regarding hx of CVST, continued use of AC and possible need of further evaluation and future recommendations, prior OCP use and future recommendations and answered all other questions to patient's satisfaction    Pearlean Brownie, [facility]  [facility] [address] [place], [place]  Phone [phone] Fax [phone] Note: This document was prepared with digital dictation and possible smart phrase technology. Any transcriptional  errors that result from this process are unintentional.

## 2022-04-13 NOTE — Progress Notes (Deleted)
[facility] [address] [place]. [place] [id number] [phone]       OFFICE FOLLOW UP NOTE  [name] Date of Birth:  15-Jun-2001 Medical Record Number:  [id number]    Primary neurologist: Dr. Pearlean BrownieSethi Reason for Referral: CVST  Reason for visit: headaches    SUBJECTIVE:   CHIEF COMPLAINT:  No chief complaint on file.   HPI:   Update 04/13/2022 JM: Patient returns for 563-month follow-up.        History provided for reference purposes only Update 12/02/2021 JM: Patient returns for acute visit due to reoccurring headaches.  Previously seen 11/1 with reported resolution of headaches but called office on 11/28 with reoccurring headaches - repeated CTV which showed resolution of prior CVST. Started on topiramate and currently taking 50 mg twice daily but continues to experience frequent headaches. Reports no benefit with topamax and causes fatigue. C/o left sided neck pain/tightness from back of ear to upper trap. Can have tightness sensation to the right shoulder and into mid/lower trapezius. Denies other areas of head pain. Feels like she cannot move her neck from side to side completely. Can worsen with increased stress or after sitting at computer for too long. Will take tylenol with some benefit. Typically worse upon awakening and towards the evening.  At first, denied increased stressors currently but then requested a note for school requesting that some of her classes to transition to online to help decrease stress load as she is feeling very overwhelmed taking numerous classes. She has remained on Eliquis 5 mg twice daily. Continues to experience heavy menstrual periods and cramping. Has not yet seen OBGYN.    Initial visit 09/07/2021 JM: Being seen for initial hospital follow-up unaccompanied.  She reports initially experiencing daily headaches for about 2 weeks after discharge but these have since resolved.  Left-sided sensory deficit resolved.  Denies  new or reoccurring neurological symptoms.  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 Use of Excedrin with resolution. More recent headaches different from typical migraine headaches.  Remains on Eliquis 5 mg twice daily - has been experiencing heavy menstrual cycles which are lasting 5-7 days in during (has had 2 cycles since discharge). She has not yet been seen by OB/GYN. She did stop OCP. Denies hx of heavy menstrual cycles even when not on BC.   No further concerns at this time   Hospitalization 07/20/2021 Rebecca Cleaverniah Coomer is a 21 y.o. female with past medical history significant for bilateral knee dislocations, oral contraceptive use, and migraine headaches, who presented on 07/20/2021 with left-sided severe headache associated with nausea, vomiting, light sensitivity, sound sensitivity and subsequently paresthesias to left arm and leg and double vision that developed while in ED.  Personally reviewed hospitalization pertinent progress notes, lab work and imaging.  Evaluated by Dr. Pearlean BrownieSethi.  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  MR brain no acute abnormality.  Of note, OCP recently changed continue estrogen.  Also recently completed prednisone course 2 days prior for knee dislocation pain.  Migraine headaches less than once a month.  Placed on heparin drip and transition to apixaban 5 mg twice daily for at least 8-9  months.  Recommended follow-up MRV 4-6 months for surveillance monitoring.  OCP discontinued and advised follow-up with OB/GYN for further management. Headache greatly improved prior to discharge.  A1c 4.8.  LDL 66.  Residual sensory  impairment to left face and arm.  Discharged home on 07/22/2021.        PERTINENT IMAGING  CT VENOGRAM HEAD  07/20/2021 IMPRESSION: 1. Occlusive thrombus within the lateral left transverse sinus  with suspected thrombosis of an adjacent cortical vein. 2. No enhancement within the left sigmoid sinus or internal jugular vein.  10/12/2021 IMPRESSION 1. No evidence of venous sinus thrombosis. Previously noted thrombus in the left transverse sinus, sigmoid sinus, and internal jugular vein are no longer seen. 2. No acute intracranial process.    MR BRAIN WO CONTRAST 07/21/2021 IMPRESSION: 1. No acute intracranial abnormality. 2. Abnormal flow void of the left transverse sinus, sigmoid sinus and internal jugular vein, consistent with known thrombosis. 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      ROS:   14 system review of systems performed and negative with exception of those listed in HPI  PMH: No past medical history on file.  PSH: No past surgical history on file.  Social History:  Social History   Socioeconomic History   Marital status: Single    Spouse name: Not on file   Number of children: Not on file   Years of education: Not on file   Highest education level: Not on file  Occupational History   Not on file  Tobacco Use   Smoking status: Not on file   Smokeless tobacco: Not on file  Substance and Sexual Activity   Alcohol use: Not on file   Drug use: Not on file   Sexual activity: Not on file  Other Topics Concern   Not on file  Social History Narrative   Not on file   Social Determinants of Health   Financial Resource Strain: Not on file  Food Insecurity: Not on file  Transportation Needs: Not on file  Physical Activity: Not on file  Stress: Not on file  Social Connections: Not on file  Intimate Partner Violence: Not on file    Family History: No family history on file.  Medications:   Current Outpatient Medications on File Prior to Visit  Medication Sig Dispense Refill   amitriptyline (ELAVIL) 25 MG tablet Take 1 tablet (25 mg total) by mouth at bedtime. 30 tablet  11   apixaban (ELIQUIS) 5 MG TABS tablet Take 1 tablet (5 mg total) by mouth 2 (two) times daily. 180 tablet 0   No current facility-administered medications on file prior to visit.    Allergies:  Not on File    OBJECTIVE:  Physical Exam  There were no vitals filed for this visit.   There is no height or weight on file to calculate BMI. No results found.  General: well developed, well nourished, young very pleasant African-American female, seated, in no evident distress Head: head normocephalic and atraumatic.   Neck: supple with no carotid or supraclavicular bruits Cardiovascular: regular rate and rhythm, no murmurs Musculoskeletal: no deformity; full neck ROM, tightness noted in b/l sternocleidomastoid and upper traps  Skin:  no rash/petichiae Vascular:  Normal pulses all extremities   Neurologic Exam Mental Status: Awake and fully alert.  Fluent speech and language.  Oriented to place and time. Recent and remote memory intact. Attention span, concentration and fund of knowledge appropriate. Mood and affect appropriate.  Cranial Nerves: Pupils equal, briskly reactive to light. Extraocular movements full without nystagmus. Visual fields full to confrontation. Hearing intact. Facial sensation intact. Face, tongue, palate moves normally and symmetrically.  Motor: Normal  bulk and tone. Normal strength in all tested extremity muscles Sensory.: intact to touch , pinprick , position and vibratory sensation.  Coordination: Rapid alternating movements normal in all extremities. Finger-to-nose and heel-to-shin performed accurately bilaterally. Gait and Station: Arises from chair without difficulty. Stance is normal. Gait demonstrates normal stride length and balance without use of AD. Tandem walk and heel toe without difficulty.  Reflexes: 1+ and symmetric. Toes downgoing.          ASSESSMENT: Rebecca Beltran is a 21 y.o. year old female with recent CVST likely in setting of OCP on  07/20/2021. Vascular risk factors include oral contraceptive use and migraine headaches. Acute visit for worsening headaches     PLAN:  New onset headaches: onset 11/28. Repeat CVT 12/6 showed resolution of CVST. Per report, appears more tension related. Hx of migraine headaches. Currently headaches more likely tension type from cervical strain likely due to increased stressors and poor posture. Stop topiramate as no benefit and causing fatigue. Try amitriptyline 25 mg nightly and referral to PT for dry needling and neck strengthening and stretching exercises.  Discussed use of moist heat, gentle neck stretching and occasional use of Tylenol as needed.  Did discuss overuse of Tylenol can cause rebound headache. No reg flags or concerning findings on exam. Advised to speak with school counselor/advisor regarding her current classes causing increased stress but suspect improvement of pain with PT CVST : repeat CVT 12/6 resoluation of CVST.  Continue Eliquis until May/June to complete recommended 8-9 mo of therapy OCP use: advised again to f/u with OB/GYN to discuss other contraceptive methods. Avoid estrogen in setting of recent CVST as well as known aura migraines. Reports heavy menstrual cycles and worsening cramping since stopping OCP    Follow up in 4 months or call earlier if needed   CC:  [facility] provider: Dr. Pearlean Brownie  I spent 38 minutes of face-to-face and non-face-to-face time with patient.  This included previsit chart review, lab review, study review, order entry, electronic health record documentation, patient education regarding new c/o of tension type headache and cervical neck pain, possible etiologies and treatment plan, hx of CVST, ongoing use of Eliquis, prior  OCP use and heavy menstrual cycles and answered all other questions to patient satisfaction   Ihor Austin, AGNP-BC  [facility] [address] [place], [place] [address]  Phone [phone]  Fax [phone] Note: This document was prepared with digital dictation and possible smart phrase technology. Any transcriptional errors that result from this process are unintentional.

## 2022-04-13 NOTE — Patient Instructions (Addendum)
Your Plan:  I will keep you updated once I hear from Dr. Pearlean Brownie regarding further recommendations  Please let me know if you have any concerns during the meantime     Thank you for coming to see Rebecca Beltran at [facility]. I hope we have been able to provide you high quality care today.  You may receive a patient satisfaction survey over the next few weeks. We would appreciate your feedback and comments so that we may continue to improve ourselves and the health of our patients.

## 2022-04-14 ENCOUNTER — Encounter: Payer: Self-pay | Admitting: Adult Health

## 2022-04-14 NOTE — Progress Notes (Signed)
I agree with the above plan 

## 2023-03-05 IMAGING — MR MR HEAD W/O CM
12 of 13 series · 44 of 48 positions shown · non-contrast
Comparison: None.

CLINICAL DATA: Headache and left eye blurry vision.

EXAM:
MRI HEAD WITHOUT CONTRAST
TECHNIQUE: Multiplanar, multiecho pulse sequences of the brain and surrounding
structures were obtained without intravenous contrast.

[Series 9: DWI · axial · 3.0mm · 0.92mm/px · z∈[-88,+51]mm · 8 of 106 slices shown (1 of 4)]
[im 1/106]
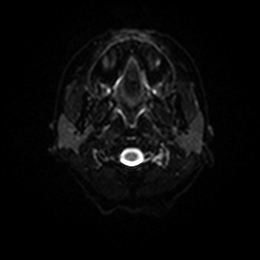
[im 16/106]
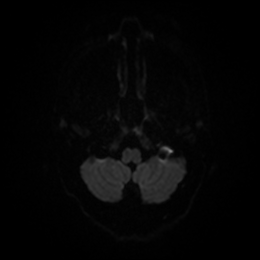
[im 31/106]
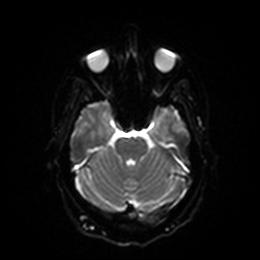
[im 46/106]
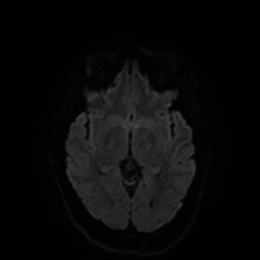
[im 61/106]
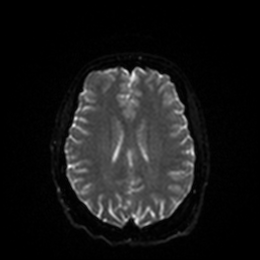
[im 76/106]
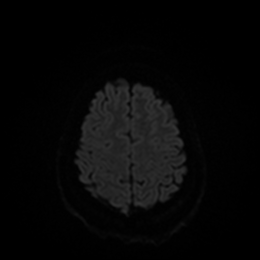
[im 91/106]
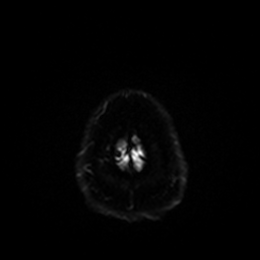
[im 106/106]
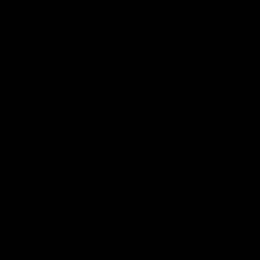

[Series 10: DWI · axial · 3.0mm · 0.92mm/px · z∈[-88,+49]mm · 4 of 52 slices shown (2 of 4)]
[im 1/52]
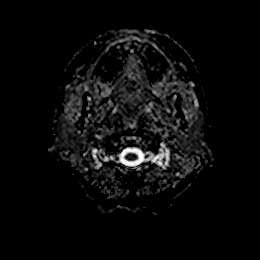
[im 18/52]
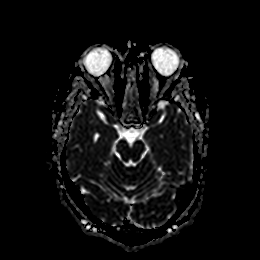
[im 35/52]
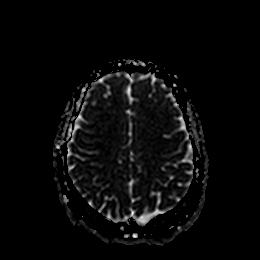
[im 52/52]
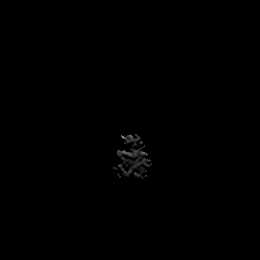

[Series 11: DWI · coronal · 4.0mm · 0.88mm/px · 5 of 72 slices shown (3 of 4)]
[im 1/72]
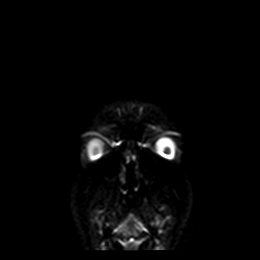
[im 18/72]
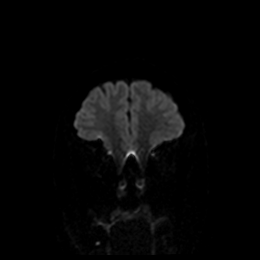
[im 36/72]
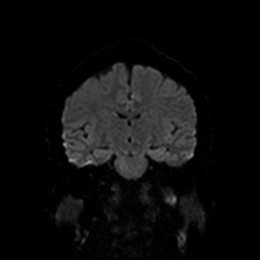
[im 54/72]
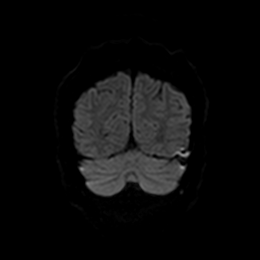
[im 72/72]
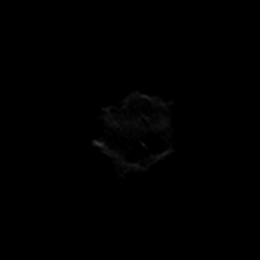

[Series 12: DWI · coronal · 4.0mm · 0.88mm/px · 3 of 36 slices shown (4 of 4)]
[im 1/36]
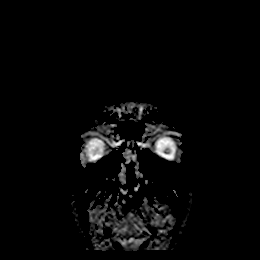
[im 18/36]
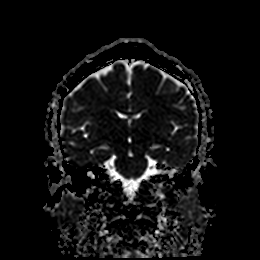
[im 36/36]
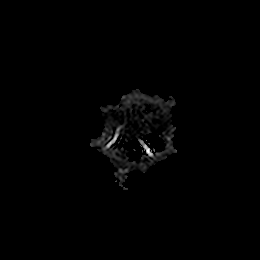

[Series 13: T1 · sagittal · 5.0mm · 0.75mm/px · 2 of 27 slices shown]
[im 1/27]
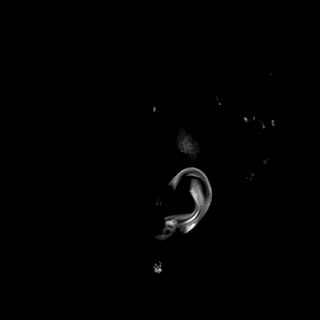
[im 27/27]
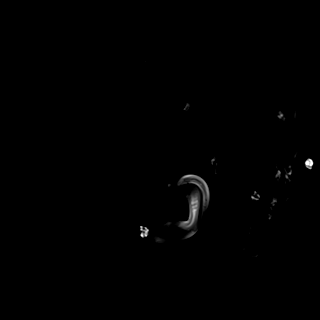

[Series 14: T2 · axial · 5.0mm · 0.75mm/px · z∈[-95,+50]mm · 2 of 28 slices shown (1 of 2)]
[im 1/28]
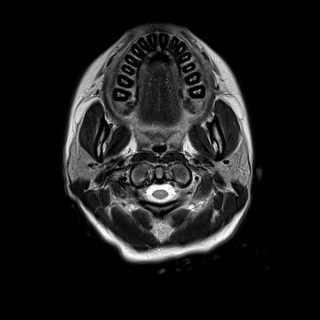
[im 28/28]
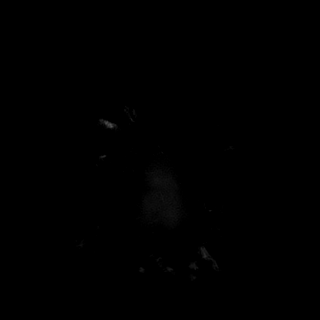

[Series 15: FLAIR · axial · 5.0mm · 0.47mm/px · z∈[-94,+51]mm · 2 of 28 slices shown]
[im 1/28]
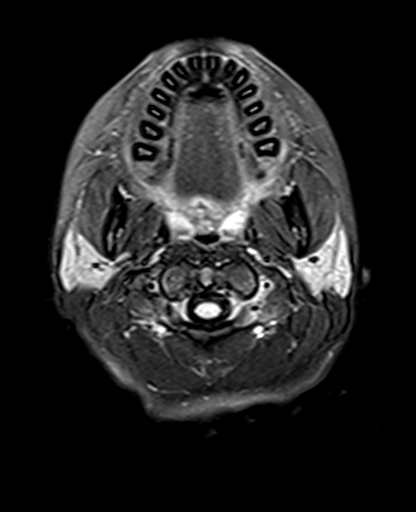
[im 28/28]
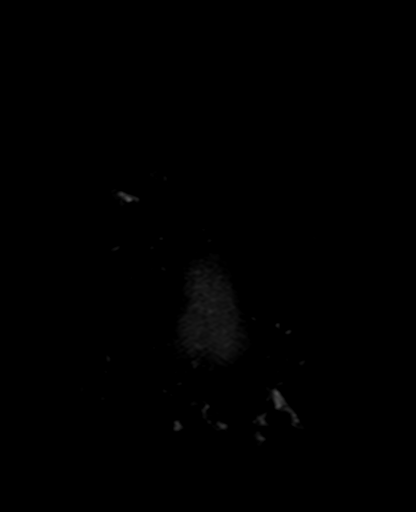

[Series 16: mag_images · axial · 3.0mm · 0.94mm/px · z∈[-98,+55]mm · 4 of 60 slices shown]
[im 1/60]
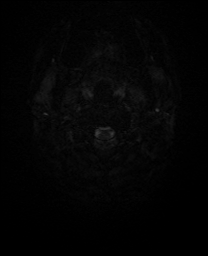
[im 20/60]
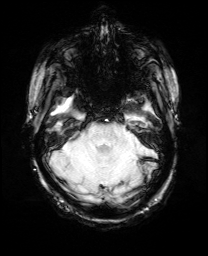
[im 40/60]
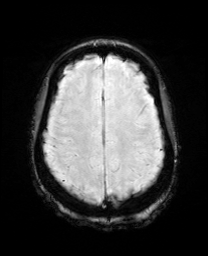
[im 60/60]
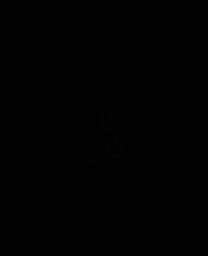

[Series 17: pha_images · axial · 3.0mm · 0.94mm/px · z∈[-98,+50]mm · 4 of 58 slices shown]
[im 1/58]
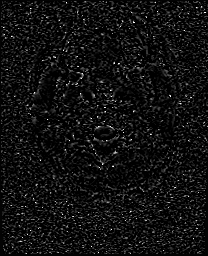
[im 20/58]
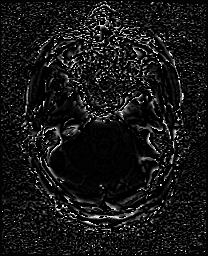
[im 39/58]
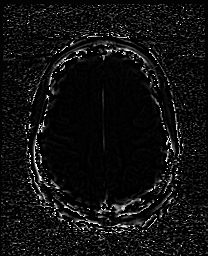
[im 58/58]
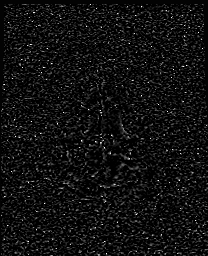

[Series 18: swi_images · axial · 3.0mm · 0.94mm/px · z∈[-98,+55]mm · 4 of 60 slices shown]
[im 1/60]
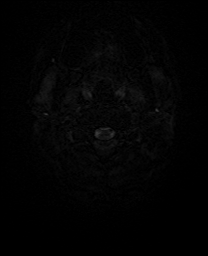
[im 20/60]
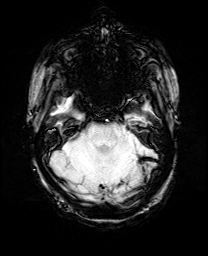
[im 40/60]
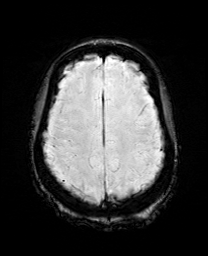
[im 60/60]
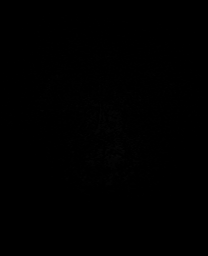

[Series 19: mip_images(sw) · axial · 24.0mm · 0.94mm/px · z∈[-89,+46]mm · 4 of 53 slices shown]
[im 1/53]
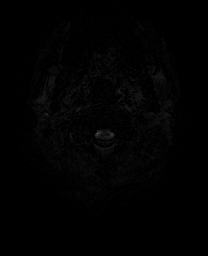
[im 18/53]
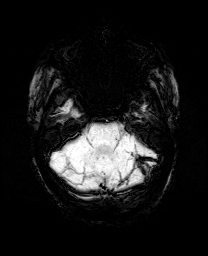
[im 35/53]
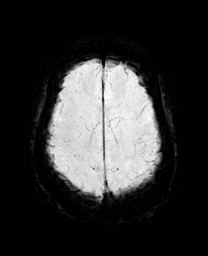
[im 53/53]
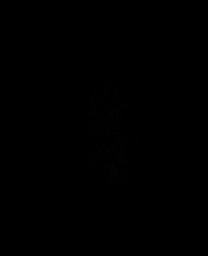

[Series 21: T2 · coronal · 5.0mm · 0.34mm/px · 2 of 33 slices shown (2 of 2)]
[im 1/33]
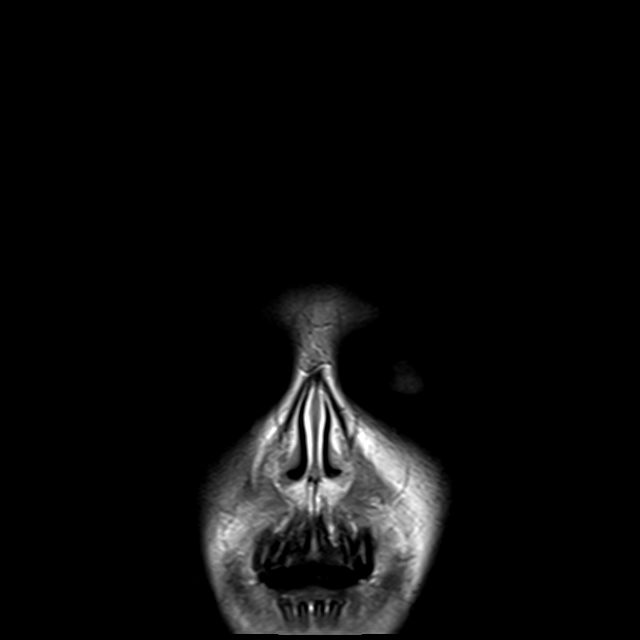
[im 33/33]
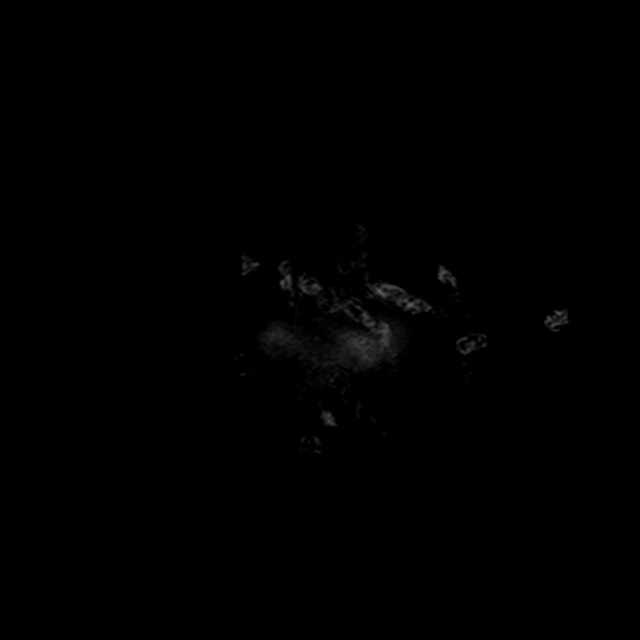

[44 of 48 positions shown; findings below may reference images not displayed]

FINDINGS: Brain: No acute infarct, mass effect or extra-axial collection. No
acute or chronic hemorrhage. Normal white matter signal, parenchymal
volume and CSF spaces. The midline structures are normal.

Vascular: Abnormal flow void of the left transverse sinus, sigmoid
sinus and internal jugular vein, consistent with known thrombosis.
Magnetic susceptibility effect at the posterior left temporal lobe
likely indicates a thrombosed cortical vein.

Skull and upper cervical spine: Normal calvarium and skull base.
Visualized upper cervical spine and soft tissues are normal.

Sinuses/Orbits:No paranasal sinus fluid levels or advanced mucosal
thickening. No mastoid or middle ear effusion. Normal orbits.
IMPRESSION: 1. No acute intracranial abnormality.
2. Abnormal flow void of the left transverse sinus, sigmoid sinus
and internal jugular vein, consistent with known thrombosis.
Magnetic susceptibility effect at the posterior left temporal lobe
likely indicates a thrombosed cortical vein.

## 2024-02-26 ENCOUNTER — Ambulatory Visit (HOSPITAL_COMMUNITY): Admission: EM | Admit: 2024-02-26 | Discharge: 2024-02-26 | Disposition: A | Discharge status: Home / Self Care

## 2024-02-26 ENCOUNTER — Encounter (HOSPITAL_COMMUNITY): Payer: Self-pay

## 2024-02-26 DIAGNOSIS — M7989 Other specified soft tissue disorders: Secondary | ICD-10-CM | POA: Diagnosis not present

## 2024-02-26 DIAGNOSIS — M79661 Pain in right lower leg: Secondary | ICD-10-CM | POA: Diagnosis not present

## 2024-02-26 HISTORY — DX: Intracranial and intraspinal phlebitis and thrombophlebitis: G08

## 2024-02-26 NOTE — Discharge Instructions (Signed)
 Alternate between 650 mg of Tylenol  and 400 mg ibuprofen every 6-8 hours as needed for pain. Alternate between heat and ice as needed for pain.  Rest and elevate as often as possible. Follow-up with [facility] if pain and swelling persist. If you notice significant swelling, discoloration, or worsening pain please seek immediate medical treatment in the emergency department. Return here as needed.

## 2024-02-26 NOTE — ED Triage Notes (Signed)
 Patient here today with c/o right leg pain, swelling, numbness, and tingling since Thursday. Patient states that her knee cap sometimes pops out of place and it goes back in on it's own. It hasn't popped out of place for a month now. Patient doing the RICE therapy since Thursday but did not help. 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 Patient takes ASA 81mg  daily.

## 2024-02-27 NOTE — ED Provider Notes (Signed)
 [facility]    CSN: [id number] Arrival date & time: 02/26/24  1707      History   Chief Complaint Chief Complaint  Patient presents with   Leg Pain    HPI Rebecca Beltran is a 23 y.o. female.   Patient presents with right lower leg pain, intermittent swelling, and intermittent numbness and tingling that began on 4/17.  Patient reports she has been doing RICE therapy which does seem to help with swelling, but does not help with the pain.  Patient states that the swelling is worse in the evening after being on her feet all day, and then subsides after resting.  Denies difficulty walking.  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  Patient does take a baby aspirin daily.  Patient denies any recent falls or injuries.   The history is provided by the patient and medical records.  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 Authorizing Provider  aspirin EC 81 MG tablet Take 81 mg by mouth daily. Swallow whole.   Yes [provider]    Family History Family History  Problem Relation Age of Onset   Hypertension Mother     Social History Social History   Tobacco Use   Smoking status: Never   Smokeless tobacco: Never  Vaping Use   Vaping status: Never Used  Substance Use Topics   Alcohol use: Yes     Comment: socially   Drug use: Yes    Types: Marijuana     Allergies   Patient has no known allergies.   Review of Systems Review of Systems  Per HPI  Physical Exam Triage Vital Signs ED Triage Vitals  Encounter Vitals Group     BP 02/26/24 1802 128/86     Systolic BP Percentile --      Diastolic BP Percentile --      Pulse Rate 02/26/24 1802 90     Resp 02/26/24 1802 16     Temp 02/26/24 1802 98.9 F (37.2 C)     Temp Source 02/26/24 1802 Oral     SpO2 02/26/24 1802 97 %     Weight --      Height --      Head Circumference --      Peak Flow --      Pain Score 02/26/24 1749 6     Pain Loc --      Pain Education --      Exclude from [employer]  Chart --    No data found.  Updated Vital Signs BP 128/86 (BP Location: Left Arm)   Pulse 90   Temp 98.9 F (37.2 C) (Oral)   Resp 16   LMP 02/25/2024 (Exact Date)   SpO2 97%   Visual Acuity Right Eye Distance:   Left Eye Distance:   Bilateral Distance:    Right Eye Near:   Left Eye Near:    Bilateral Near:     Physical Exam Vitals and nursing note reviewed.  Constitutional:      General: She is awake. She is not in acute distress.    Appearance: Normal appearance. She is well-developed and well-groomed. She is not ill-appearing.  Cardiovascular:     Rate and Rhythm: Normal rate and regular rhythm.     Pulses: Normal pulses.     Heart sounds: Normal heart sounds.  Pulmonary:     Effort: Pulmonary effort is normal.     Breath sounds: Normal breath sounds.  Musculoskeletal:     Right lower leg: Swelling and tenderness present. No deformity or bony tenderness. No edema.     Left lower leg: No edema.  Skin:    General: Skin is warm and dry.  Neurological:     Mental Status: She is alert.  Psychiatric:        Behavior: Behavior is cooperative.      UC Treatments / Results  Labs (all labs ordered are listed, but only abnormal results are displayed) Labs Reviewed - No data to  display  EKG   Radiology No results found.  Procedures Procedures (including critical care time)  Medications Ordered in UC Medications - No data to display  Initial Impression / Assessment and Plan / UC Course  I have reviewed the triage vital signs and the nursing notes.  Pertinent labs & imaging results that were available during my care of the patient were reviewed by me and considered in my medical decision making (see chart for details).     Patient is well-appearing.  Vitals are stable.  Upon assessment there is very mild swelling noted to right lower leg without discoloration or obvious edema.  Right lower leg is tender upon palpation.  Unlikely DVT due to resolving with rest and exam unrevealing very mild swelling.  Recommend continuing RICE therapy and alternate between Tylenol  ibuprofen as needed for pain.  Given [name] to follow-up with if pain persists.  Discussed return and strict ER precautions. Final Clinical Impressions(s) / UC Diagnoses   Final diagnoses:  Pain and swelling of right lower leg     Discharge Instructions      Alternate between 650 mg of Tylenol  and 400 mg ibuprofen every 6-8 hours as needed for pain. Alternate between heat and ice as needed for pain.  Rest and elevate as often as possible. Follow-up with [facility] if pain and swelling persist. If you notice significant swelling, discoloration, or worsening pain please seek immediate medical treatment in the emergency department. Return here as needed.    ED Prescriptions   None    PDMP not reviewed this encounter.   Levora Reas A, NP 02/27/24 1015
